# Patient Record
Sex: Male | Born: 1960 | Race: White | Hispanic: No | Marital: Married | Smoking: Former smoker
Health system: Southern US, Community
[De-identification: ages and names within clinical notes are randomized; demographics above are authoritative.]

## PROBLEM LIST (undated history)

## (undated) DIAGNOSIS — G8929 Other chronic pain: Secondary | ICD-10-CM

## (undated) DIAGNOSIS — J302 Other seasonal allergic rhinitis: Secondary | ICD-10-CM

## (undated) DIAGNOSIS — Z951 Presence of aortocoronary bypass graft: Secondary | ICD-10-CM

## (undated) DIAGNOSIS — R079 Chest pain, unspecified: Secondary | ICD-10-CM

## (undated) DIAGNOSIS — M549 Dorsalgia, unspecified: Secondary | ICD-10-CM

## (undated) DIAGNOSIS — R112 Nausea with vomiting, unspecified: Secondary | ICD-10-CM

## (undated) DIAGNOSIS — I25119 Atherosclerotic heart disease of native coronary artery with unspecified angina pectoris: Secondary | ICD-10-CM

## (undated) DIAGNOSIS — K219 Gastro-esophageal reflux disease without esophagitis: Secondary | ICD-10-CM

## (undated) DIAGNOSIS — F419 Anxiety disorder, unspecified: Secondary | ICD-10-CM

## (undated) DIAGNOSIS — Z8709 Personal history of other diseases of the respiratory system: Secondary | ICD-10-CM

## (undated) DIAGNOSIS — R600 Localized edema: Secondary | ICD-10-CM

## (undated) DIAGNOSIS — R609 Edema, unspecified: Secondary | ICD-10-CM

## (undated) DIAGNOSIS — I219 Acute myocardial infarction, unspecified: Secondary | ICD-10-CM

## (undated) DIAGNOSIS — E785 Hyperlipidemia, unspecified: Secondary | ICD-10-CM

## (undated) DIAGNOSIS — I493 Ventricular premature depolarization: Secondary | ICD-10-CM

## (undated) DIAGNOSIS — I10 Essential (primary) hypertension: Secondary | ICD-10-CM

## (undated) DIAGNOSIS — Z860100 Personal history of colon polyps, unspecified: Secondary | ICD-10-CM

## (undated) DIAGNOSIS — Z9889 Other specified postprocedural states: Secondary | ICD-10-CM

## (undated) DIAGNOSIS — I2510 Atherosclerotic heart disease of native coronary artery without angina pectoris: Secondary | ICD-10-CM

## (undated) DIAGNOSIS — R238 Other skin changes: Secondary | ICD-10-CM

## (undated) DIAGNOSIS — R233 Spontaneous ecchymoses: Secondary | ICD-10-CM

## (undated) DIAGNOSIS — I209 Angina pectoris, unspecified: Secondary | ICD-10-CM

## (undated) HISTORY — PX: COLONOSCOPY: SHX174

## (undated) HISTORY — DX: Presence of aortocoronary bypass graft: Z95.1

## (undated) HISTORY — DX: Essential (primary) hypertension: I10

## (undated) HISTORY — PX: CORONARY ANGIOPLASTY: SHX604

## (undated) HISTORY — DX: Chest pain, unspecified: R07.9

## (undated) HISTORY — DX: Atherosclerotic heart disease of native coronary artery with unspecified angina pectoris: I25.119

## (undated) HISTORY — DX: Ventricular premature depolarization: I49.3

---

## 1992-01-26 HISTORY — PX: CORONARY ARTERY BYPASS GRAFT: SHX141

## 1997-12-23 ENCOUNTER — Inpatient Hospital Stay (HOSPITAL_COMMUNITY): Admission: EM | Admit: 1997-12-23 | Discharge: 1997-12-25 | Payer: Self-pay | Admitting: Cardiology

## 1999-01-26 HISTORY — PX: CARDIAC CATHETERIZATION: SHX172

## 1999-10-14 ENCOUNTER — Inpatient Hospital Stay (HOSPITAL_COMMUNITY): Admission: EM | Admit: 1999-10-14 | Discharge: 1999-10-15 | Payer: Self-pay | Admitting: Cardiovascular Disease

## 1999-10-28 ENCOUNTER — Inpatient Hospital Stay (HOSPITAL_COMMUNITY): Admission: AD | Admit: 1999-10-28 | Discharge: 1999-10-29 | Payer: Self-pay | Admitting: Cardiovascular Disease

## 1999-10-29 ENCOUNTER — Encounter: Payer: Self-pay | Admitting: Internal Medicine

## 2000-07-26 ENCOUNTER — Ambulatory Visit (HOSPITAL_COMMUNITY): Admission: RE | Admit: 2000-07-26 | Discharge: 2000-07-26 | Payer: Self-pay | Admitting: Cardiovascular Disease

## 2003-12-23 ENCOUNTER — Ambulatory Visit: Payer: Self-pay | Admitting: Cardiovascular Disease

## 2003-12-25 ENCOUNTER — Ambulatory Visit: Payer: Self-pay | Admitting: Cardiovascular Disease

## 2003-12-25 ENCOUNTER — Ambulatory Visit (HOSPITAL_COMMUNITY): Admission: AD | Admit: 2003-12-25 | Discharge: 2003-12-25 | Payer: Self-pay | Admitting: Cardiovascular Disease

## 2003-12-30 ENCOUNTER — Ambulatory Visit: Payer: Self-pay | Admitting: Cardiovascular Disease

## 2010-06-12 NOTE — Cardiovascular Report (Signed)
[place]. [facility]  Patient:    Patrick Goodwin, Patrick Goodwin                        MRN: [id number] Proc. Date: 07/26/00 Adm. Date:  [id number] Attending:  Colon Branch CC:         Dr. Tomasa Blase, [facility]   Cardiac Catheterization  PROCEDURE PERFORMED:  Coronary arteriography.  INDICATIONS:  Previous coronary artery bypass grafting with chest pain and abnormal Cardiolite study suggesting anterior apical wall ischemia.  CORONARY ARTERIOGRAPHY:  Left main coronary artery:  The left main coronary artery had a 70% discrete lesion.  Left anterior descending artery:  The left anterior descending artery had an 80% ostial lesion.  The first diagonal branch had 80% multiple discrete lesions but was not very approachable through the left main and native system. The mid LAD was 100% occluded.  Circumflex coronary artery:  The circumflex coronary artery was 100% occluded proximally.  Right coronary artery:  The right coronary artery was 100% occluded proximally.  Left internal mammary artery is widely patent in the mid and distal LAD. There was no retrograde filling I the diagonal branches.  The saphenous vein graft to the OM had 20-30% multiple discrete lesions in the mid body.  Saphenous vein graft to the RCA had 60% tubular lesion in the mid body.  This was fairly stable compared to his previous catheterization in 2001.  RIGHT ANTERIOR OBLIQUE VENTRICULOGRAPHY:  RAO ventriculography revealed mild inferior wall hypokinesis with an EF of 55%.  There was no MR.  LV pressure was 138/7, aortic pressure was 137/64.  IMPRESSION:  The patients Cardiolite study suggested ischemia in the anteroapical region.  The left anterior descending supplies this territory well.  The diagonal branch is not well revascularized but is stable since his previous catheterization.  Similarly, the right coronary artery, although it had moderate disease is smooth and there was no ischemia  in the inferior wall by Cardiolite.  RECOMMENDATIONS:  Overall, I think continued medical management is warranted. I will see the patient back in the office in two months.  He will be discharged home later today so long as his groin heals well. DD:  07/26/00 TD:  07/26/00 Job: [id number]

## 2010-06-12 NOTE — Cardiovascular Report (Signed)
[place]. [facility]  Patient:    Patrick Goodwin, Patrick Goodwin                        MRN: [id number] Proc. Date: 10/14/99 Adm. Date:  [id number] Attending:  Colon Branch CC:         Barney Drain, M.D., [place], [place]             Noralyn Pick. Eden Emms, M.D. [facility]             Cardiac Catheterization Laboratory                        Cardiac Catheterization  PROCEDURES PERFORMED: 1. Left heart catheterization with coronary angiography, bypass    graft angiography, and abdominal aortography. 2. Percutaneous transluminal coronary angioplasty with stent placement    in the proximal portion of the saphenous vein graft to the obtuse marginal    branch.  INDICATIONS:  Patrick Goodwin is a 50 year old male who is status post coronary artery bypass surgery.  He presented to the emergency room at [facility] last evening with unstable angina.  He was started on heparin and ReoPro and transferred to [facility].  After stabilization he was referred for cardiac catheterization.  CATHETERIZATION PROCEDURE:  A 6 French sheath was placed in the left femoral artery.  Standard Judkins 6 French catheters were utilized.  Contrast was Omnipaque.  There were no complications.  CATHETERIZATION RESULTS:  HEMODYNAMICS:  Left ventricular pressure 124/15.  Aortic pressure 124/80. There was no aortic valve gradient.  LEFT VENTRICULOGRAM:  There is mild akinesis of the apical wall, otherwise normal wall motion.  Ejection fraction calculated at 52%.  No mitral regurgitation.  CORONARY ARTERIOGRAPHY:  (Right dominant).  Left main:  Normal.  Left anterior descending:  The left anterior descending artery has an 80% stenosis in the proximal vessel before the first diagonal.  After the first diagonal is an 80% stenosis in the mid vessel followed by a 95% stenosis just proximal to the internal mammary artery insertion.  The LAD gives rise to a large first diagonal which has three branches.  The  inferior most branch of this diagonal is 100% occluded and fills by left to left collaterals from the distal LAD.  Left circumflex:  The left circumflex has a 90% stenosis proximally followed by 100% occlusion of the mid vessel.  Right coronary artery:  The right coronary artery is 100% occluded proximally near it ostium.  Left internal mammary artery to the mid LAD is patent throughout its course. In the LAD just beyond the anastomosis is a 25% stenosis.  Saphenous vein graft to the obtuse marginal has a very complex irregular 95% stenosis in the proximal graft just after the ostium with filling defect consistent with probable thrombus.  The mid body of the graft has a 25% stenosis.  Sequential saphenous vein graft to the acute marginal and posterior descending artery is patent throughout its course.  This fills a normal sized acute marginal, a normal sized posterior descending artery, also filling a normal sized first posterolateral and a small second posterolateral branch.  In addition the distal right coronary artery gives collaterals to a small distal obtuse marginal branch.  IMPRESSION: 1. Mildly decreased left ventricular systolic function. 2. Native three-vessel coronary artery disease as described. 3. Status post coronary artery bypass surgery characterized by patent    graft to the left anterior descending artery in the right  coronary artery    system. 4. Very complex 95% stenosis in the proximal portion of the vein graft and    obtuse marginal as described.  PLAN:  Percutaneous intervention to the obtuse marginal; see below.  PERCUTANEOUS TRANSLUMINAL CORONARY ANGIOPLASTY PROCEDURE:  Following completion of diagnostic catheterization, we proceeded with coronary intervention.  The patient was enrolled in the [facility] protocol and randomized to conventional therapy.  We used a 7 [place] LCD guiding catheter and a BMW wire.  Predilatation lesion was performed with a  3.0 x 15 mm Maverick balloon inflated to 4 atmospheres.  We then deployed a 4.0 x 15 mm NIR Elite stent with the proximal portion of the stent right in the ostium of the graft.  This stent was deployed at 10 atmospheres.  Angiographic images at that point revealed an excellent result with 0% stenosis and TIMI-3 flow.  COMPLICATIONS:  None.  RESULTS:  Successful percutaneous transluminal coronary angioplasty with stent placement in the ostium and proximal portion of the saphenous vein graft to obtuse marginal branch.  A complex 95% stenosis with probable thrombus was reduced to 0% residual with TIMI-3 flow.  PLAN:  ReoPro will be continued for 12 hours.  The patient will be treated with Plavix for a minimum of four weeks. DD:  10/14/99 TD:  10/14/99 Job: [id number]

## 2010-06-12 NOTE — Discharge Summary (Signed)
[place]. [facility]  Patient:    Patrick Goodwin, Patrick Goodwin                        MRN: [id number] Adm. Date:  [id number] Disc. Date: [id number] Attending:  Colon Branch Dictator:   Lavella Hammock, P.A. CC:         Dr. Nadine Counts in [place], [place].   Referring Physician Discharge [facility]  DATE OF BIRTH:  11-02-1960  PROCEDURES: 1. Cardiac catheterization. 2. Coronary arteriogram. 3. Left ventriculogram. 4. PTCA and stent of one vessel.  HOSPITAL COURSE:  Patrick Goodwin is a 50 year old male with a history of MI in his 61s and bypass surgery in 1994 with LIMA to LAD, SVG to OM-1, SVG to PD/PLA. He was seen in the office and had a Cardiolite which showed ischemia, and a catheterization was to be arranged.  However, he had prolonged pain on the day of admission and came to the emergency room at [facility].  He was evaluated there and transferred to [facility] for further workup and possible catheterization.  He ruled out for an MI but he was scheduled for a cardiac catheterization and this was done on October 14, 1999.  He had a normal left main, an LAD with an 80% proximal, an 80% mid, and a 95% lesion. He had a 20% distal lesion.  The first diagonal was totaled proximally.  He had a circumflex with a 90% proximal lesion that was then totaled.  The RCA was totalled.  The LIMA to LAD was patent.  The SVG to OM-1 had a 95% proximal stenosis with some haze and possible thrombus.  The SVG to PD/PLA had a 50% mid lesion.  He had PTCA and stent to the SVG to OM-1, which reduced the stenosis from 95% to 0 with TIMI 3 flow.  His EF showed mild apical akinesis with an EF calculated a 52% and no MR.  He tolerated the procedure well and the sheath was removed without difficulty.  His post procedure CK-MB was negative.  His EKG was unchanged.  He was evaluated the next day and considered stable for discharge.  LABORATORY VALUES:  Hemoglobin 14.0,  hematocrit 38.5, wbcs 10.3, platelets 223.  Serial CK-MBs negative for MI before and after procedure.  EKG:  Sinus rhythm and sinus arrhythmia with first degree AV block and a P-R of 0.210.  Incomplete right bundle-branch block and old inferior infarct.  DISCHARGE CONDITION:  Improved  CONSULTS:  None.  COMPLICATIONS:  None.  DISCHARGE DIAGNOSES: 1. Severe native three-vessel coronary artery disease. 2. Status post percutaneous transluminal coronary angioplasty and stent to the    saphenous vein graft to first obtuse marginal, this admission. 3. History of coronary artery bypass grafting in 1994 with left internal    mammary artery to left anterior descending artery, saphenous vein graft    to first obtuse marginal, and saphenous vein graft to posterior    descending/posterolateral artery. 4. Hyperlipidemia. 5. Family history of coronary artery disease. 6. History of a myocardial infarction in his 77s. 7. History of right bundle-branch block.  DISCHARGE INSTRUCTIONS:  ACTIVITY:  His activity is to include no driving, no sexual or strenuous activity for two days.  He is to return to work October 26, 1999.  DIET:  He is to stick to a low fat diet.  WOUND CARE:  He is to call the office for bleeding, swelling, or drainage at the  catheterization site.  FOLLOW-UP:  He has a P.A. appointment on October 11 at 8:30 a.m.  He is to see Dr. Eden Emms in two months.  He is to follow up with Dr. Nadine Counts in [place] as needed.  DISCHARGE MEDICATIONS: 1. Lipitor 20 mg q.d. 2. Lopressor 50 mg one-half tablet b.i.d. 3. Coated aspirin 325 mg q.d. 4. Plavix 75 mg q.d. for one month. 5. Nitroglycerin 0.4 mg sublingual p.r.n. 6. Prilosec 20 mg p.o. q.d. DD:  10/15/99 TD:  10/15/99 Job: [id number]

## 2010-06-12 NOTE — Cardiovascular Report (Signed)
[place]. [facility]  Patient:    Patrick Goodwin, Patrick Goodwin                        MRN: [id number] Proc. Date: 10/29/99 Adm. Date:  [id number] Disc. Date: [id number] Attending:  Colon Branch CC:         Nadine Counts, M.D.  Noralyn Pick. Eden Emms, M.D. [facility]   Cardiac Catheterization  DATE OF BIRTH:  [birth year]  PRIMARY PHYSICIAN:  Nadine Counts, M.D.  CARDIOLOGIST:  Noralyn Pick. Eden Emms, M.D.  PROCEDURE:  Left heart cardiac catheterization/coronary arteriography.  INDICATIONS:  Evaluate patient with known coronary artery disease, status post bypass and angioplasty.  Most recently he had angioplasty and stenting of an ostial vein graft to circumflex marginal lesion.  He presented with recurrent chest discomfort (786.51).  DESCRIPTION OF PROCEDURE:  Left heart catheterization was performed via the right femoral artery.  The artery was cannulated using an anterior wall puncture.  A #6 French arterial sheath was inserted via the modified Seldinger technique.  Preformed Judkins, pigtail, and LIMA catheters were utilized.  The patient tolerated the procedure well and left the lab in stable condition.  RESULTS:  HEMODYNAMICS:  LV 123/21, AO 121/59.  CORONARY ARTERIOGRAPHY:  Left main:  The left main coronary artery was normal.  Left anterior descending:  The LAD proximal 80% stenosis followed by mid long 80% stenosis and a focal 95% stenosis.  There was a small mid diagonal which occluded and filled via collaterals.  Circumflex:  The circumflex had a 95% stenosis followed by an occluded proximal segment.  There was a large mid marginal which filled a vein graft.  Right coronary artery:  The right coronary artery was not selectively injected.  It had previously been demonstrated to be occluded at the ostium. It was a dominant vessel.  GRAFTS:  A LIMA to the LAD was patent.  Saphenous vein graft sequential to the right coronary acute marginal and PDA was patent.   There was a mid vein graft 50% lesion before the first anastomosis to the right coronary artery.  Saphenous vein graft to the obtuse marginal was patent including a widely patent ostial and proximal stented area.  LEFT VENTRICULOGRAM:  The left ventriculogram was obtained in the RAO projection.  The EF was approximately 55%.  CONCLUSION:  Severe native three-vessel coronary artery disease.  Patent bypass grafts with a patent saphenous vein graft stent.  PLAN:  No further cardiovascular testing is suggested.  The patient will continue with aggressive secondary risk factor modification. DD:  10/29/99 TD:  10/30/99 Job: [id number]

## 2010-06-12 NOTE — Cardiovascular Report (Signed)
NAME:  Patrick Goodwin, Patrick Goodwin                 ACCOUNT NO.:  [id number]   MEDICAL RECORD NO.:  [id number]          PATIENT TYPE:  OIB   LOCATION:  2855                         FACILITY:  [facility]   PHYSICIAN:  Charlton Haws, M.D.     DATE OF BIRTH:  [birth year]   DATE OF PROCEDURE:  12/25/2003  DATE OF DISCHARGE:                              CARDIAC CATHETERIZATION   PROCEDURE:  Coronary arteriography.   INDICATIONS:  Previous bypass surgery, end-stenting to the ostium of the  saphenous vein graft to the OM, chest pain, and palpitations.   The patient has had a lot of scar tissue on the right side before with pain  during catheterization.  Therefore, we elected to use a 5 French sheath in  the left femoral artery.   RESULTS:  1.  The left main coronary artery had a 30% diffuse stenosis.  2.  Left anterior descending artery was 100% occluded after take off of the      first diagonal branch.  The first diagonal branch had 70% multiple      discreet lesions.  3.  The circumflex coronary artery was 100% occluded proximally.  4.  The right coronary artery was 100% occluded proximally.  5.  Saphenous vein graft to the right coronary artery had a 60% lesion in      the mid to distal body.  The PDA and PLA themselves were somewhat small      and diffusely diseased.  6.  The saphenous vein graft to the obtuse marginal branch had a widely      patent ostial stent.  Just distal to the stent, there was a 50%      eccentric stenosis and a 40-50% lesion in the mid to distal body of the      graft.  7.  The left internal mammary artery was widely patent to the LAD.   RAO VENTRICULOGRAPHY:  RAO ventriculography showed inferior/posterior basil  wall hypokinesis with an EF of 50%.  There is no gradient across the aortic  valve and no MR.  Aortic pressure is 125/70, LV pressure is 120/114.   IMPRESSION:  I suspect the patient can continue to be treated medically.  We  will have to give him an event monitor to  make sure he is not having an  arrhythmia.  I will have the films reviewed by our interventionalist to see  if they would intervene on the mid-body of the right.  However, during his  last catheterization, he had 50-60% disease and I do not think it is  currently flow-limiting.  However, to have this moderate degree of stenosis  and already having a stent to one vein graft at the age of 71 does not bode  well for the future.   Unfortunately, his LIMA graft is widely patent to the LAD.  I will see the  patient back in the office in a few weeks and again order an event monitor  to rule out any significant tachyarrhythmia.     PN/MEDQ  D:  12/25/2003  T:  12/25/2003  Job:  [id number] 

## 2010-06-12 NOTE — Discharge Summary (Signed)
[place]. [facility]  Patient:    Patrick Goodwin, Patrick Goodwin                        MRN: [id number] Adm. Date:  [id number] Disc. Date: [id number] Attending:  Colon Branch Dictator:   Tereso Newcomer, P.A. CC:         Nadine Counts, M.D.   Referring Physician Discharge [facility]  REASON FOR ADMISSION:  Chest pain consistent with unstable angina.  DISCHARGE DIAGNOSES 1. Coronary artery disease, status post coronary artery bypass graft. 2. First degree atrioventricular block. 3. Right bundle branch block. 4. History of inferior myocardial infarction. 5. Hyperlipidemia.  PROCEDURES PERFORMED THIS ADMISSION:  Cardiac catheterization on October 29, 1999 by Dr. Rollene Rotunda revealing:  Normal left main.  LAD: Proximal 80%, mid long 80% and 95%, diagonal occluded.  Circumflex:  95% proximal, 100% mid.  RCA not injected; 100% proximal on previous films.  LIMA to LAD patent.  SVG to RCA and acute marginal patent.  SVG to OM patent. Stent patent.  ADMISSION HISTORY:  This 50 year old male presented for followup to the office on October 28, 1999.  Since the patients discharge from the hospital on October 15, 1999, he continued to not feel well.  He described generalized fatigue along with some anterior chest discomfort, described as pressure feeling with occasional radiation to the left shoulder and to the back of the neck.  He usually has nausea and palpitations associated with this and the discomfort occurs at any time.  It usually occurred about two times a week and was relieved with sublingual nitroglycerin.  On the night prior to admission, he had had a worse episode and decided to go to [facility].  At [facility], his vital signs were stable.  EKGs did not show any acute changes.  CKs and troponins were negative x 2.  Dr. Doylene Canning. Ladona Ridgel was consulted by the [facility].  He instructed [facility] ER staff to discharge the patient to home if his  second troponin was negative, with close followup in the office the next day.  PHYSICAL EXAMINATION:  Initial physical exam revealed a well-developed white male in no acute distress.  NECK:  Without JVD.  LUNGS:  Clear to auscultation bilaterally.  HEART:  Regular rate and rhythm.  ABDOMEN:  Without abdominal bruits, tenderness or masses.  EXTREMITIES:  Femoral pulses 4+ without bruits. No signs of edema.  HOSPITAL COURSE:  It was decided to admit the patient for coronary angiography to evaluate his symptoms, which seemed consistent with unstable angina.  This was decided upon, especially in light of his recent stent placement to the SVG to OM.  The patient remained stable on October 29, 1999 without pain.  He was taken to the cardiac catheterization lab.  The results of the procedure are noted above.  He had no immediate complications.  On the evening of October 29, 1999, he was found to be in stable condition, with some slight leg pain but no hematoma and intact pulses.  After ambulation, it was decided that patient was stable enough for discharge to home on his current medication regimen.  LABORATORY FINDINGS:  INR 1.1, CK 100.8, CK-MB 0.8, troponin I 0.01.  Labs from [facility]:  Hemoglobin 13.5, hematocrit 39.8.  Electrolytes reportedly within normal limits, according to his admission H&P.  DISCHARGE MEDICATIONS 1. Lopressor 100 mg b.i.d. 2. Plavix 75 mg q.d. 3. Prilosec 20 mg q.d. 4. Aspirin 325  mg q.d. 5. Lipitor 40 mg two tablets p.o. q.h.s. 6. Clorazepate 3.75 mg b.i.d. 7. Sublingual nitroglycerin p.r.n. chest pain.  ACTIVITY:  No driving, heavy lifting or exertion or sex for three days.  DIET:  Low-fat, low-salt, low-cholesterol diet.  WOUND CARE:  The patient is to watch his groin for any increased swelling, bleeding or bruising and to call office with concerns.  FOLLOWUP:  He is to follow up with Dr. Theron Arista C. Nishan in two weeks; he is to call for an  appointment. DD:  10/29/99 TD:  10/30/99 Job: [id number]

## 2010-06-12 NOTE — Discharge Summary (Signed)
[place]. [facility]  Patient:    Patrick Goodwin, Patrick Goodwin                        MRN: [id number] Adm. Date:  [id number] Disc. Date: [id number] Attending:  Colon Branch Dictator:   Tereso Newcomer, P.A.                           Discharge Summary  The patients chest x-ray was read as showing no acute process by the radiologist. DD:  10/29/99 TD:  10/30/99 Job: [id number]

## 2012-03-07 DIAGNOSIS — M4316 Spondylolisthesis, lumbar region: Secondary | ICD-10-CM | POA: Diagnosis present

## 2012-03-07 DIAGNOSIS — M48061 Spinal stenosis, lumbar region without neurogenic claudication: Secondary | ICD-10-CM | POA: Diagnosis present

## 2012-03-21 ENCOUNTER — Encounter (HOSPITAL_COMMUNITY): Payer: Self-pay

## 2012-03-21 ENCOUNTER — Encounter (HOSPITAL_COMMUNITY)
Admission: RE | Admit: 2012-03-21 | Discharge: 2012-03-21 | Disposition: A | Discharge status: Home / Self Care | Payer: Commercial Managed Care - PPO | Source: Ambulatory Visit | Attending: Anesthesiology | Admitting: Anesthesiology

## 2012-03-21 ENCOUNTER — Encounter (HOSPITAL_COMMUNITY)
Admission: RE | Admit: 2012-03-21 | Discharge: 2012-03-21 | Disposition: A | Discharge status: Home / Self Care | Payer: Commercial Managed Care - PPO | Source: Ambulatory Visit | Attending: Orthopedic Surgery | Admitting: Orthopedic Surgery

## 2012-03-21 ENCOUNTER — Encounter (HOSPITAL_COMMUNITY): Payer: Self-pay | Admitting: Pharmacy Technician

## 2012-03-21 HISTORY — DX: Other seasonal allergic rhinitis: J30.2

## 2012-03-21 HISTORY — DX: Other chronic pain: G89.29

## 2012-03-21 HISTORY — DX: Other specified postprocedural states: R11.2

## 2012-03-21 HISTORY — DX: Personal history of colonic polyps: Z86.010

## 2012-03-21 HISTORY — DX: Spontaneous ecchymoses: R23.3

## 2012-03-21 HISTORY — DX: Localized edema: R60.0

## 2012-03-21 HISTORY — DX: Acute myocardial infarction, unspecified: I21.9

## 2012-03-21 HISTORY — DX: Dorsalgia, unspecified: M54.9

## 2012-03-21 HISTORY — DX: Hyperlipidemia, unspecified: E78.5

## 2012-03-21 HISTORY — DX: Edema, unspecified: R60.9

## 2012-03-21 HISTORY — DX: Other specified postprocedural states: Z98.890

## 2012-03-21 HISTORY — DX: Angina pectoris, unspecified: I20.9

## 2012-03-21 HISTORY — DX: Personal history of other diseases of the respiratory system: Z87.09

## 2012-03-21 HISTORY — DX: Gastro-esophageal reflux disease without esophagitis: K21.9

## 2012-03-21 HISTORY — DX: Personal history of colon polyps, unspecified: Z86.0100

## 2012-03-21 HISTORY — DX: Essential (primary) hypertension: I10

## 2012-03-21 HISTORY — DX: Atherosclerotic heart disease of native coronary artery without angina pectoris: I25.10

## 2012-03-21 HISTORY — DX: Anxiety disorder, unspecified: F41.9

## 2012-03-21 HISTORY — DX: Other skin changes: R23.8

## 2012-03-21 LAB — CBC
HCT: 44.7 % (ref 39.0–52.0)
RBC: 5.31 MIL/uL (ref 4.22–5.81)
RDW: 14.7 % (ref 11.5–15.5)
WBC: 9.4 10*3/uL (ref 4.0–10.5)

## 2012-03-21 LAB — SURGICAL PCR SCREEN
MRSA, PCR: NEGATIVE
Staphylococcus aureus: NEGATIVE

## 2012-03-21 LAB — TYPE AND SCREEN
ABO/RH(D): O POS
Antibody Screen: NEGATIVE

## 2012-03-21 LAB — BASIC METABOLIC PANEL
CO2: 26 mEq/L (ref 19–32)
Chloride: 100 mEq/L (ref 96–112)
Creatinine, Ser: 0.92 mg/dL (ref 0.50–1.35)
GFR calc Af Amer: >90 mL/min (ref >90)
Potassium: 4 mEq/L (ref 3.5–5.1)
Sodium: 137 mEq/L (ref 135–145)

## 2012-03-21 LAB — PROTIME-INR: INR: 1.01 (ref 0.00–1.49)

## 2012-03-21 LAB — ABO/RH: ABO/RH(D): O POS

## 2012-03-21 NOTE — Progress Notes (Signed)
Called and let Dr.Munley's nurse know that since pt has come off of ASA and Plavix on the 19th he has noticed chest pain but not to the point of needing Nitroglycerin

## 2012-03-21 NOTE — Pre-Procedure Instructions (Signed)
[place] Blasco [facility]  03/21/2012   Your procedure is scheduled on:  Thurs, Feb 27 @ 7:30 AM  Report to [facility] at 5:30 AM.  Call this number if you have problems the morning of surgery: [phone]   Remember:   Do not eat food or drink liquids after midnight.   Take these medicines the morning of surgery with A SIP OF WATER:   Do not wear jewelry, make-up or nail polish.  Do not wear lotions, powders, or perfumes. You may wear deodorant.  Do not shave 48 hours prior to surgery.  Do not bring valuables to the hospital.  Contacts, dentures or bridgework may not be worn into surgery.  Leave suitcase in the car. After surgery it may be brought to your room.  For patients admitted to the hospital, checkout time is 11:00 AM the day of  discharge.   Patients discharged the day of surgery will not be allowed to drive  home.    Special Instructions: Shower using CHG 2 nights before surgery and the night before surgery.  If you shower the day of surgery use CHG.  Use special wash - you have one bottle of CHG for all showers.  You should use approximately 1/3 of the bottle for each shower.   Please read over the following fact sheets that you were given: Pain Booklet, Coughing and Deep Breathing, MRSA Information and Surgical Site Infection Prevention

## 2012-03-21 NOTE — Progress Notes (Addendum)
Cardiologist is Dr.Munley with last visit 6months ago   Stress test and echo to be requested from Dr.Munley as well as office visit Heart cath to requested from [facility]  Denies CXR being done within past yr  EKG requested from Dr.Munley

## 2012-03-21 NOTE — Progress Notes (Signed)
03/21/12 1540  OBSTRUCTIVE SLEEP APNEA  Have you ever been diagnosed with sleep apnea through a sleep study? No  Do you snore loudly (loud enough to be heard through closed doors)?  1  Do you often feel tired, fatigued, or sleepy during the daytime? 0  Has anyone observed you stop breathing during your sleep? 1  Do you have, or are you being treated for high blood pressure? 1  BMI more than 35 kg/m2? 0  Age over 51 years old? 1  Neck circumference greater than 40 cm/18 inches? (19)  Gender: 1  Obstructive Sleep Apnea Score 5  Score 4 or greater  Results sent to PCP

## 2012-03-22 MED ORDER — DEXAMETHASONE SODIUM PHOSPHATE 4 MG/ML IJ SOLN
4.0000 mg | Freq: Once | INTRAMUSCULAR | Status: DC
  Filled 2012-03-22: qty 1

## 2012-03-22 MED ORDER — CEFAZOLIN SODIUM-DEXTROSE 2-3 GM-% IV SOLR
2.0000 g | INTRAVENOUS | Status: AC
  Administered 2012-03-23: 2 g via INTRAVENOUS
  Filled 2012-03-22: qty 50

## 2012-03-22 NOTE — Progress Notes (Signed)
Anesthesia Chart Review:  Patient is a 52 year old male scheduled for lateral L3-4 fusion with posterior L3-4 fusion by Dr. Shon Baton on 03/23/12.  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  PCP is Dr. Veatrice Kells.  Cardiologist is Dr. Dulce Sellar with [facility]).  He had cleared patient for this procedure, but patient reported that he had experienced daily chest pain since stopping his ASA and Plavix on 03/15/12.  The patient did not feel that it was severe enough to warrant Nitro.  I was going to evaluate Mr. Cronkright PAT testing, but he was inadvertently told to leave after his x-rays.  I did speak with Dr. Dulce Sellar yesterday afternoon about patient's reports of chest pain.  I also faxed him a copy of his PAT EKG.  His office will call patient for a 03/22/12 appointment. (Update: 03/22/12 1625 Received a note from Dr. Dulce Sellar dated 03/22/12.  He still feels patient is stable from a cardiac standpoint.  He feels patient has chronic stable angina. He recommended patient take his metoprolol and Ranexa on the morning of surgery and resume ASA and Plavix as soon as felt safe.  Dr. Dulce Sellar can be reached for questions at [phone].)  EKG on 03/21/12 showed SB @ 59 BPM, incomplete right BBB, inferior infarct (age undetermined).   Treadmill exercise test on 11/03/11 [facility]) showed good exercise capacity, normal treadmill exercise test chief heart rate, negative for ischemia. Normal blood pressure response to exercise.  Cardiac cath on 09/18/10 at [facility] showed 3V occlusive native CAD with 3/3 CABG grafts patent.  Mildly reduced LV systolic function, EF 50%, mild inferior hypokinesis.  CXR on 03/21/12 showed no acute cardiopulmonary findings.  Preoperative labs noted.  Sherry at Dr. Shon Baton office notified of plan for cardiology re-evaluation.  She  will follow-up with [facility] later today for Dr. Hulen Shouts final recommendations.  Shonna Chock, PA-C 03/22/12 [phone]

## 2012-03-23 ENCOUNTER — Encounter (HOSPITAL_COMMUNITY)
Admission: RE | Disposition: A | Discharge status: Home / Self Care | Payer: Self-pay | Source: Ambulatory Visit | Attending: Orthopedic Surgery

## 2012-03-23 ENCOUNTER — Encounter (HOSPITAL_COMMUNITY): Payer: Self-pay | Admitting: *Deleted

## 2012-03-23 ENCOUNTER — Inpatient Hospital Stay (HOSPITAL_COMMUNITY)
Admission: RE | Admit: 2012-03-23 | Discharge: 2012-03-25 | DRG: 460 | Disposition: A | Discharge status: Home / Self Care | Payer: Commercial Managed Care - PPO | Source: Ambulatory Visit | Attending: Orthopedic Surgery | Admitting: Orthopedic Surgery

## 2012-03-23 ENCOUNTER — Encounter (HOSPITAL_COMMUNITY): Payer: Self-pay | Admitting: Vascular Surgery

## 2012-03-23 ENCOUNTER — Ambulatory Visit (HOSPITAL_COMMUNITY): Payer: Commercial Managed Care - PPO

## 2012-03-23 ENCOUNTER — Ambulatory Visit (HOSPITAL_COMMUNITY): Payer: Commercial Managed Care - PPO | Admitting: Anesthesiology

## 2012-03-23 DIAGNOSIS — I209 Angina pectoris, unspecified: Secondary | ICD-10-CM | POA: Diagnosis present

## 2012-03-23 DIAGNOSIS — E785 Hyperlipidemia, unspecified: Secondary | ICD-10-CM | POA: Diagnosis present

## 2012-03-23 DIAGNOSIS — J309 Allergic rhinitis, unspecified: Secondary | ICD-10-CM | POA: Diagnosis present

## 2012-03-23 DIAGNOSIS — M5137 Other intervertebral disc degeneration, lumbosacral region: Principal | ICD-10-CM | POA: Diagnosis present

## 2012-03-23 DIAGNOSIS — I252 Old myocardial infarction: Secondary | ICD-10-CM

## 2012-03-23 DIAGNOSIS — F411 Generalized anxiety disorder: Secondary | ICD-10-CM | POA: Diagnosis present

## 2012-03-23 DIAGNOSIS — Z79899 Other long term (current) drug therapy: Secondary | ICD-10-CM

## 2012-03-23 DIAGNOSIS — E669 Obesity, unspecified: Secondary | ICD-10-CM | POA: Diagnosis present

## 2012-03-23 DIAGNOSIS — M51379 Other intervertebral disc degeneration, lumbosacral region without mention of lumbar back pain or lower extremity pain: Principal | ICD-10-CM | POA: Diagnosis present

## 2012-03-23 DIAGNOSIS — I2510 Atherosclerotic heart disease of native coronary artery without angina pectoris: Secondary | ICD-10-CM | POA: Diagnosis present

## 2012-03-23 DIAGNOSIS — G8929 Other chronic pain: Secondary | ICD-10-CM | POA: Diagnosis present

## 2012-03-23 DIAGNOSIS — Z860100 Personal history of colon polyps, unspecified: Secondary | ICD-10-CM

## 2012-03-23 DIAGNOSIS — I10 Essential (primary) hypertension: Secondary | ICD-10-CM | POA: Diagnosis present

## 2012-03-23 DIAGNOSIS — K219 Gastro-esophageal reflux disease without esophagitis: Secondary | ICD-10-CM | POA: Diagnosis present

## 2012-03-23 DIAGNOSIS — R609 Edema, unspecified: Secondary | ICD-10-CM | POA: Diagnosis present

## 2012-03-23 DIAGNOSIS — M48061 Spinal stenosis, lumbar region without neurogenic claudication: Secondary | ICD-10-CM | POA: Diagnosis present

## 2012-03-23 DIAGNOSIS — M4316 Spondylolisthesis, lumbar region: Secondary | ICD-10-CM | POA: Diagnosis present

## 2012-03-23 HISTORY — PX: ANTERIOR LAT LUMBAR FUSION: SHX1168

## 2012-03-23 SURGERY — ANTERIOR LATERAL LUMBAR FUSION 1 LEVEL
Anesthesia: General | Site: Back | Wound class: Clean

## 2012-03-23 MED ORDER — ACETAMINOPHEN 10 MG/ML IV SOLN
INTRAVENOUS | Status: AC
  Filled 2012-03-23: qty 100

## 2012-03-23 MED ORDER — MORPHINE SULFATE (PF) 1 MG/ML IV SOLN
INTRAVENOUS | Status: AC
  Filled 2012-03-23: qty 25

## 2012-03-23 MED ORDER — LACTATED RINGERS IV SOLN
INTRAVENOUS | Status: DC | PRN
  Administered 2012-03-23 (×2): via INTRAVENOUS

## 2012-03-23 MED ORDER — PANTOPRAZOLE SODIUM 40 MG PO TBEC
40.0000 mg | DELAYED_RELEASE_TABLET | Freq: Every day | ORAL | Status: DC
  Administered 2012-03-24 – 2012-03-25 (×2): 40 mg via ORAL
  Filled 2012-03-23 (×2): qty 1

## 2012-03-23 MED ORDER — DIPHENHYDRAMINE HCL 12.5 MG/5ML PO ELIX
12.5000 mg | ORAL_SOLUTION | Freq: Four times a day (QID) | ORAL | Status: DC | PRN
  Administered 2012-03-23 – 2012-03-24 (×2): 12.5 mg via ORAL
  Filled 2012-03-23 (×2): qty 10

## 2012-03-23 MED ORDER — NALOXONE HCL 0.4 MG/ML IJ SOLN: 0.4000 mg | INTRAMUSCULAR | Status: DC | PRN

## 2012-03-23 MED ORDER — THROMBIN 20000 UNITS EX SOLR
CUTANEOUS | Status: AC
  Filled 2012-03-23: qty 20000

## 2012-03-23 MED ORDER — ONDANSETRON HCL 4 MG/2ML IJ SOLN: 4.0000 mg | INTRAMUSCULAR | Status: DC | PRN

## 2012-03-23 MED ORDER — LISINOPRIL 10 MG PO TABS
10.0000 mg | ORAL_TABLET | Freq: Every day | ORAL | Status: DC
  Administered 2012-03-24 – 2012-03-25 (×2): 10 mg via ORAL
  Filled 2012-03-23 (×2): qty 1

## 2012-03-23 MED ORDER — SUCCINYLCHOLINE CHLORIDE 20 MG/ML IJ SOLN
INTRAMUSCULAR | Status: DC | PRN
  Administered 2012-03-23: 160 mg via INTRAVENOUS

## 2012-03-23 MED ORDER — HEMOSTATIC AGENTS (NO CHARGE) OPTIME
TOPICAL | Status: DC | PRN
  Administered 2012-03-23: 1 via TOPICAL

## 2012-03-23 MED ORDER — ONDANSETRON HCL 4 MG/2ML IJ SOLN: 4.0000 mg | Freq: Four times a day (QID) | INTRAMUSCULAR | Status: DC | PRN

## 2012-03-23 MED ORDER — LIDOCAINE HCL (CARDIAC) 20 MG/ML IV SOLN
INTRAVENOUS | Status: DC | PRN
  Administered 2012-03-23: 100 mg via INTRAVENOUS

## 2012-03-23 MED ORDER — METHOCARBAMOL 500 MG PO TABS
500.0000 mg | ORAL_TABLET | Freq: Four times a day (QID) | ORAL | Status: DC | PRN
  Administered 2012-03-24: 500 mg via ORAL
  Filled 2012-03-23: qty 1

## 2012-03-23 MED ORDER — DEXTROSE 5 % IV SOLN
INTRAVENOUS | Status: DC | PRN
  Administered 2012-03-23: 08:00:00 via INTRAVENOUS

## 2012-03-23 MED ORDER — MIDAZOLAM HCL 5 MG/5ML IJ SOLN
INTRAMUSCULAR | Status: DC | PRN
  Administered 2012-03-23: 2 mg via INTRAVENOUS

## 2012-03-23 MED ORDER — FENTANYL CITRATE 0.05 MG/ML IJ SOLN: 50.0000 ug | Freq: Once | INTRAMUSCULAR | Status: DC

## 2012-03-23 MED ORDER — DEXAMETHASONE 4 MG PO TABS
4.0000 mg | ORAL_TABLET | Freq: Four times a day (QID) | ORAL | Status: DC
  Administered 2012-03-23 – 2012-03-25 (×7): 4 mg via ORAL
  Filled 2012-03-23 (×11): qty 1

## 2012-03-23 MED ORDER — DEXAMETHASONE SODIUM PHOSPHATE 4 MG/ML IJ SOLN
4.0000 mg | Freq: Four times a day (QID) | INTRAMUSCULAR | Status: DC
  Administered 2012-03-24: 4 mg via INTRAVENOUS
  Filled 2012-03-23 (×11): qty 1

## 2012-03-23 MED ORDER — RANOLAZINE ER 500 MG PO TB12
1000.0000 mg | ORAL_TABLET | Freq: Two times a day (BID) | ORAL | Status: DC
  Administered 2012-03-23 – 2012-03-25 (×4): 1000 mg via ORAL
  Filled 2012-03-23 (×5): qty 2

## 2012-03-23 MED ORDER — LACTATED RINGERS IV SOLN
INTRAVENOUS | Status: DC
  Administered 2012-03-23 – 2012-03-24 (×2): via INTRAVENOUS

## 2012-03-23 MED ORDER — FUROSEMIDE 20 MG PO TABS
20.0000 mg | ORAL_TABLET | Freq: Every day | ORAL | Status: DC
  Administered 2012-03-24 – 2012-03-25 (×2): 20 mg via ORAL
  Filled 2012-03-23 (×2): qty 1

## 2012-03-23 MED ORDER — ATORVASTATIN CALCIUM 10 MG PO TABS
10.0000 mg | ORAL_TABLET | Freq: Every day | ORAL | Status: DC
  Administered 2012-03-23 – 2012-03-24 (×2): 10 mg via ORAL
  Filled 2012-03-23 (×3): qty 1

## 2012-03-23 MED ORDER — METHOCARBAMOL 100 MG/ML IJ SOLN
500.0000 mg | Freq: Four times a day (QID) | INTRAVENOUS | Status: DC | PRN
  Filled 2012-03-23: qty 5

## 2012-03-23 MED ORDER — DIPHENHYDRAMINE HCL 50 MG/ML IJ SOLN: 12.5000 mg | Freq: Four times a day (QID) | INTRAMUSCULAR | Status: DC | PRN

## 2012-03-23 MED ORDER — CLOPIDOGREL BISULFATE 75 MG PO TABS
75.0000 mg | ORAL_TABLET | Freq: Every day | ORAL | Status: DC
  Administered 2012-03-25: 75 mg via ORAL
  Filled 2012-03-23 (×2): qty 1

## 2012-03-23 MED ORDER — DOCUSATE SODIUM 100 MG PO CAPS
100.0000 mg | ORAL_CAPSULE | Freq: Two times a day (BID) | ORAL | Status: DC
  Administered 2012-03-23 – 2012-03-25 (×4): 100 mg via ORAL
  Filled 2012-03-23 (×4): qty 1

## 2012-03-23 MED ORDER — EZETIMIBE 10 MG PO TABS
10.0000 mg | ORAL_TABLET | Freq: Every day | ORAL | Status: DC
  Administered 2012-03-24 – 2012-03-25 (×2): 10 mg via ORAL
  Filled 2012-03-23 (×2): qty 1

## 2012-03-23 MED ORDER — MORPHINE SULFATE (PF) 1 MG/ML IV SOLN
INTRAVENOUS | Status: DC
  Administered 2012-03-23: 3 mg via INTRAVENOUS
  Administered 2012-03-23: 2 mg via INTRAVENOUS
  Administered 2012-03-23: 12:00:00 via INTRAVENOUS
  Administered 2012-03-24: 1 mg via INTRAVENOUS
  Administered 2012-03-24: 4 mg via INTRAVENOUS

## 2012-03-23 MED ORDER — OXYCODONE HCL 5 MG/5ML PO SOLN: 5.0000 mg | Freq: Once | ORAL | Status: DC | PRN

## 2012-03-23 MED ORDER — PROPOFOL 10 MG/ML IV BOLUS
INTRAVENOUS | Status: DC | PRN
  Administered 2012-03-23: 260 mg via INTRAVENOUS

## 2012-03-23 MED ORDER — OXYCODONE HCL 5 MG PO TABS
10.0000 mg | ORAL_TABLET | ORAL | Status: DC | PRN
  Administered 2012-03-24 – 2012-03-25 (×3): 10 mg via ORAL
  Filled 2012-03-23 (×3): qty 2

## 2012-03-23 MED ORDER — ACETAMINOPHEN 10 MG/ML IV SOLN
1000.0000 mg | Freq: Four times a day (QID) | INTRAVENOUS | Status: AC
  Administered 2012-03-23 – 2012-03-24 (×4): 1000 mg via INTRAVENOUS
  Filled 2012-03-23 (×4): qty 100

## 2012-03-23 MED ORDER — ARTIFICIAL TEARS OP OINT
TOPICAL_OINTMENT | OPHTHALMIC | Status: DC | PRN
  Administered 2012-03-23: 1 via OPHTHALMIC

## 2012-03-23 MED ORDER — FENTANYL CITRATE 0.05 MG/ML IJ SOLN
INTRAMUSCULAR | Status: DC | PRN
  Administered 2012-03-23 (×5): 50 ug via INTRAVENOUS
  Administered 2012-03-23: 100 ug via INTRAVENOUS
  Administered 2012-03-23 (×2): 50 ug via INTRAVENOUS
  Administered 2012-03-23: 100 ug via INTRAVENOUS

## 2012-03-23 MED ORDER — CEFAZOLIN SODIUM 1-5 GM-% IV SOLN
1.0000 g | Freq: Three times a day (TID) | INTRAVENOUS | Status: AC
  Administered 2012-03-23 – 2012-03-24 (×2): 1 g via INTRAVENOUS
  Filled 2012-03-23 (×2): qty 50

## 2012-03-23 MED ORDER — GLYCOPYRROLATE 0.2 MG/ML IJ SOLN
INTRAMUSCULAR | Status: DC | PRN
  Administered 2012-03-23: 0.4 mg via INTRAVENOUS

## 2012-03-23 MED ORDER — DEXAMETHASONE SODIUM PHOSPHATE 10 MG/ML IJ SOLN
INTRAMUSCULAR | Status: AC
  Administered 2012-03-23: 10 mg via INTRAVENOUS
  Filled 2012-03-23: qty 1

## 2012-03-23 MED ORDER — ONDANSETRON HCL 4 MG/2ML IJ SOLN
INTRAMUSCULAR | Status: DC | PRN
  Administered 2012-03-23 (×2): 4 mg via INTRAVENOUS

## 2012-03-23 MED ORDER — OXYCODONE HCL 5 MG PO TABS: 5.0000 mg | ORAL_TABLET | Freq: Once | ORAL | Status: DC | PRN

## 2012-03-23 MED ORDER — HYDROMORPHONE HCL PF 1 MG/ML IJ SOLN
INTRAMUSCULAR | Status: AC
  Filled 2012-03-23: qty 1

## 2012-03-23 MED ORDER — METOPROLOL TARTRATE 100 MG PO TABS: 100.0000 mg | ORAL_TABLET | Freq: Two times a day (BID) | ORAL | Status: DC

## 2012-03-23 MED ORDER — METOPROLOL TARTRATE 100 MG PO TABS
150.0000 mg | ORAL_TABLET | Freq: Every day | ORAL | Status: DC
  Administered 2012-03-24 – 2012-03-25 (×2): 150 mg via ORAL
  Filled 2012-03-23 (×2): qty 1

## 2012-03-23 MED ORDER — SODIUM CHLORIDE 0.9 % IJ SOLN: 3.0000 mL | INTRAMUSCULAR | Status: DC | PRN

## 2012-03-23 MED ORDER — PROPOFOL INFUSION 10 MG/ML OPTIME
INTRAVENOUS | Status: DC | PRN
  Administered 2012-03-23: 50 ug/kg/min via INTRAVENOUS

## 2012-03-23 MED ORDER — SODIUM CHLORIDE 0.9 % IJ SOLN: 9.0000 mL | INTRAMUSCULAR | Status: DC | PRN

## 2012-03-23 MED ORDER — FLEET ENEMA 7-19 GM/118ML RE ENEM: 1.0000 | ENEMA | Freq: Once | RECTAL | Status: AC | PRN

## 2012-03-23 MED ORDER — ALBUTEROL SULFATE HFA 108 (90 BASE) MCG/ACT IN AERS
2.0000 | INHALATION_SPRAY | Freq: Four times a day (QID) | RESPIRATORY_TRACT | Status: DC | PRN
  Filled 2012-03-23: qty 6.7

## 2012-03-23 MED ORDER — PHENOL 1.4 % MT LIQD: 1.0000 | OROMUCOSAL | Status: DC | PRN

## 2012-03-23 MED ORDER — ALPRAZOLAM 0.5 MG PO TABS
0.5000 mg | ORAL_TABLET | Freq: Three times a day (TID) | ORAL | Status: DC | PRN
  Administered 2012-03-24: 0.5 mg via ORAL
  Filled 2012-03-23: qty 1

## 2012-03-23 MED ORDER — BUPIVACAINE-EPINEPHRINE 0.25% -1:200000 IJ SOLN
INTRAMUSCULAR | Status: DC | PRN
  Administered 2012-03-23: 10 mL

## 2012-03-23 MED ORDER — THROMBIN 20000 UNITS EX SOLR
OROMUCOSAL | Status: DC | PRN
  Administered 2012-03-23: 09:00:00 via TOPICAL

## 2012-03-23 MED ORDER — SODIUM CHLORIDE 0.9 % IV SOLN: 250.0000 mL | INTRAVENOUS | Status: DC

## 2012-03-23 MED ORDER — ACETAMINOPHEN 10 MG/ML IV SOLN
INTRAVENOUS | Status: DC | PRN
  Administered 2012-03-23: 1000 mg via INTRAVENOUS

## 2012-03-23 MED ORDER — PROMETHAZINE HCL 25 MG/ML IJ SOLN: 6.2500 mg | INTRAMUSCULAR | Status: DC | PRN

## 2012-03-23 MED ORDER — METOPROLOL TARTRATE 100 MG PO TABS
100.0000 mg | ORAL_TABLET | Freq: Every day | ORAL | Status: DC
  Administered 2012-03-23 – 2012-03-24 (×2): 100 mg via ORAL
  Filled 2012-03-23 (×3): qty 1

## 2012-03-23 MED ORDER — SODIUM CHLORIDE 0.9 % IJ SOLN
3.0000 mL | Freq: Two times a day (BID) | INTRAMUSCULAR | Status: DC
  Administered 2012-03-24: 3 mL via INTRAVENOUS

## 2012-03-23 MED ORDER — MENTHOL 3 MG MT LOZG: 1.0000 | LOZENGE | OROMUCOSAL | Status: DC | PRN

## 2012-03-23 MED ORDER — EPHEDRINE SULFATE 50 MG/ML IJ SOLN
INTRAMUSCULAR | Status: DC | PRN
  Administered 2012-03-23: 15 mg via INTRAVENOUS

## 2012-03-23 MED ORDER — SODIUM CHLORIDE 0.9 % IV SOLN
10.0000 mg | INTRAVENOUS | Status: DC | PRN
  Administered 2012-03-23: 25 ug/min via INTRAVENOUS

## 2012-03-23 MED ORDER — MIDAZOLAM HCL 2 MG/2ML IJ SOLN: 1.0000 mg | INTRAMUSCULAR | Status: DC | PRN

## 2012-03-23 MED ORDER — HYDROMORPHONE HCL PF 1 MG/ML IJ SOLN
0.2500 mg | INTRAMUSCULAR | Status: DC | PRN
  Administered 2012-03-23 (×2): 0.5 mg via INTRAVENOUS

## 2012-03-23 SURGICAL SUPPLY — 75 items
ADH SKN CLS APL DERMABOND .7 (GAUZE/BANDAGES/DRESSINGS) ×1
APPLIER CLIP 11 MED OPEN (CLIP)
APR CLP MED 11 20 MLT OPN (CLIP)
BLADE SURG 10 STRL SS (BLADE) ×2 IMPLANT
BLADE SURG ROTATE 9660 (MISCELLANEOUS) ×1 IMPLANT
CAGE COUGAR IS 18X12X55 (Cage) ×1 IMPLANT
CLIP APPLIE 11 MED OPEN (CLIP) IMPLANT
CLOTH BEACON ORANGE TIMEOUT ST (SAFETY) ×2 IMPLANT
CLSR STERI-STRIP ANTIMIC 1/2X4 (GAUZE/BANDAGES/DRESSINGS) ×2 IMPLANT
CORDS BIPOLAR (ELECTRODE) ×2 IMPLANT
COVER SURGICAL LIGHT HANDLE (MISCELLANEOUS) ×2 IMPLANT
DERMABOND ADVANCED (GAUZE/BANDAGES/DRESSINGS) ×1
DERMABOND ADVANCED .7 DNX12 (GAUZE/BANDAGES/DRESSINGS) ×1 IMPLANT
DRAPE C-ARM 42X72 X-RAY (DRAPES) ×2 IMPLANT
DRAPE ORTHO SPLIT 77X108 STRL (DRAPES) ×2
DRAPE POUCH INSTRU U-SHP 10X18 (DRAPES) ×2 IMPLANT
DRAPE SURG ORHT 6 SPLT 77X108 (DRAPES) ×1 IMPLANT
DRAPE U-SHAPE 47X51 STRL (DRAPES) ×4 IMPLANT
DRSG MEPILEX BORDER 4X8 (GAUZE/BANDAGES/DRESSINGS) ×3 IMPLANT
DURAPREP 26ML APPLICATOR (WOUND CARE) ×2 IMPLANT
ELECT BLADE 4.0 EZ CLEAN MEGAD (MISCELLANEOUS) ×2
ELECT CAUTERY BLADE 6.4 (BLADE) ×2 IMPLANT
ELECT REM PT RETURN 9FT ADLT (ELECTROSURGICAL) ×2
ELECTRODE BLDE 4.0 EZ CLN MEGD (MISCELLANEOUS) ×1 IMPLANT
ELECTRODE REM PT RTRN 9FT ADLT (ELECTROSURGICAL) ×1 IMPLANT
GAUZE SPONGE 4X4 16PLY XRAY LF (GAUZE/BANDAGES/DRESSINGS) ×2 IMPLANT
GLOVE BIOGEL PI IND STRL 6.5 (GLOVE) ×1 IMPLANT
GLOVE BIOGEL PI IND STRL 8.5 (GLOVE) ×1 IMPLANT
GLOVE BIOGEL PI INDICATOR 6.5 (GLOVE) ×1
GLOVE BIOGEL PI INDICATOR 8.5 (GLOVE) ×1
GLOVE ECLIPSE 6.0 STRL STRAW (GLOVE) ×2 IMPLANT
GLOVE ECLIPSE 8.5 STRL (GLOVE) ×2 IMPLANT
GOWN PREVENTION PLUS XXLARGE (GOWN DISPOSABLE) ×2 IMPLANT
GOWN STRL NON-REIN LRG LVL3 (GOWN DISPOSABLE) ×4 IMPLANT
GUIDEWIRE 1.6X480MM (WIRE) ×2 IMPLANT
GUIDEWIRE PIPELINE IS BLUNT (WIRE) ×1 IMPLANT
KIT BASIN OR (CUSTOM PROCEDURE TRAY) ×2 IMPLANT
KIT ORACLE NEUROMONITING (KITS) ×1 IMPLANT
KIT ROOM TURNOVER OR (KITS) ×2 IMPLANT
MIX DBX 20CC MTF (Putty) ×1 IMPLANT
NDL I-PASS III (NEEDLE) IMPLANT
NDL SPNL 18GX3.5 QUINCKE PK (NEEDLE) ×1 IMPLANT
NEEDLE 22X1 1/2 (OR ONLY) (NEEDLE) ×2 IMPLANT
NEEDLE I-PASS III (NEEDLE) IMPLANT
NEEDLE SPNL 18GX3.5 QUINCKE PK (NEEDLE) ×2 IMPLANT
NS IRRIG 1000ML POUR BTL (IV SOLUTION) ×2 IMPLANT
PACK LAMINECTOMY ORTHO (CUSTOM PROCEDURE TRAY) ×2 IMPLANT
PACK UNIVERSAL I (CUSTOM PROCEDURE TRAY) ×2 IMPLANT
PAD ARMBOARD 7.5X6 YLW CONV (MISCELLANEOUS) ×4 IMPLANT
ROD MATRIX MIS 45MM (Rod) ×1 IMPLANT
SCREW MATRIX MIS 6.0X45MM (Screw) ×2 IMPLANT
SPONGE INTESTINAL PEANUT (DISPOSABLE) ×4 IMPLANT
SPONGE LAP 4X18 X RAY DECT (DISPOSABLE) ×2 IMPLANT
SPONGE SURGIFOAM ABS GEL 100 (HEMOSTASIS) ×1 IMPLANT
STRIP CLOSURE SKIN 1/2X4 (GAUZE/BANDAGES/DRESSINGS) IMPLANT
SURGIFLO TRUKIT (HEMOSTASIS) ×1 IMPLANT
SUT MNCRL AB 3-0 PS2 18 (SUTURE) ×2 IMPLANT
SUT PROLENE 5 0 C 1 24 (SUTURE) IMPLANT
SUT SILK 2 0 TIES 10X30 (SUTURE) ×1 IMPLANT
SUT SILK 3 0 TIES 10X30 (SUTURE) ×1 IMPLANT
SUT VIC AB 1 CT1 27 (SUTURE) ×4
SUT VIC AB 1 CT1 27XBRD ANBCTR (SUTURE) ×2 IMPLANT
SUT VIC AB 1 CTX 36 (SUTURE) ×4
SUT VIC AB 1 CTX36XBRD ANBCTR (SUTURE) ×2 IMPLANT
SUT VIC AB 2-0 CT1 18 (SUTURE) ×2 IMPLANT
SYR BULB IRRIGATION 50ML (SYRINGE) ×2 IMPLANT
SYR CONTROL 10ML LL (SYRINGE) ×2 IMPLANT
TAP CANN 5MM (TAP) ×1 IMPLANT
TAPE CLOTH 4X10 WHT NS (GAUZE/BANDAGES/DRESSINGS) ×4 IMPLANT
TOWEL OR 17X24 6PK STRL BLUE (TOWEL DISPOSABLE) ×2 IMPLANT
TOWEL OR 17X26 10 PK STRL BLUE (TOWEL DISPOSABLE) ×2 IMPLANT
TRAY FOLEY CATH 14FRSI W/METER (CATHETERS) ×2 IMPLANT
WATER STERILE IRR 1000ML POUR (IV SOLUTION) ×2 IMPLANT
WAVEGUIDE NARROW/FLAT LIGHT (MISCELLANEOUS) ×2 IMPLANT
matrix mis locking cap ×2 IMPLANT

## 2012-03-23 NOTE — Transfer of Care (Signed)
Immediate Anesthesia Transfer of Care Note  Patient: Patrick Goodwin  Procedure(s) Performed: Procedure(s) with comments: ANTERIOR LATERAL LUMBAR FUSION 1 LEVEL (N/A) - XLIF L3-4 WITH POSTERIOR SPINAL FUSION INTERBODY L3-4  Patient Location: PACU  Anesthesia Type:General  Level of Consciousness: oriented, sedated, patient cooperative and responds to stimulation  Airway & Oxygen Therapy: Patient Spontanous Breathing and Patient connected to nasal cannula oxygen  Post-op Assessment: Report given to PACU RN, Post -op Vital signs reviewed and stable, Patient moving all extremities and Patient moving all extremities X 4  Post vital signs: Reviewed and stable  Complications: No apparent anesthesia complications

## 2012-03-23 NOTE — Anesthesia Postprocedure Evaluation (Signed)
  Anesthesia Post-op Note  Patient: Patrick Goodwin  Procedure(s) Performed: Procedure(s) with comments: ANTERIOR LATERAL LUMBAR FUSION 1 LEVEL (N/A) - XLIF L3-4 WITH POSTERIOR SPINAL FUSION INTERBODY L3-4  Patient Location: PACU  Anesthesia Type:General  Level of Consciousness: awake  Airway and Oxygen Therapy: Patient Spontanous Breathing  Post-op Pain: mild  Post-op Assessment: Post-op Vital signs reviewed, Patient's Cardiovascular Status Stable, Respiratory Function Stable, Patent Airway, No signs of Nausea or vomiting and Pain level controlled  Post-op Vital Signs: stable  Complications: No apparent anesthesia complications

## 2012-03-23 NOTE — Progress Notes (Signed)
UR COMPLETED  

## 2012-03-23 NOTE — Op Note (Signed)
NAME:  Patrick Goodwin, Patrick Goodwin                 ACCOUNT NO.:  [id number]  MEDICAL RECORD NO.:  [id number]  LOCATION:  [id number]                        FACILITY:  [facility]  PHYSICIAN:  Alvy Beal, MD    DATE OF BIRTH:  05/07/1960  DATE OF PROCEDURE: DATE OF DISCHARGE:                              OPERATIVE REPORT   PREOPERATIVE DIAGNOSIS:  Degenerative lumbar disk disease with spinal stenosis L3-4.  POSTOPERATIVE DIAGNOSIS:  Degenerative lumbar disk disease with spinal stenosis L3-4.  OPERATIVE PROCEDURE:  Anterolateral L3-4 interbody fusion using a DePuy radiolucent carbon fiber cage, size 12 parallel packed with DBX mix with supplemental posterior pedicle screw fixation and posterolateral arthrodesis.  Pedicle screw system was Synthes MIS matrix pedicle screws 45 mm in length, 6.5 diameter screws with DBX mix for the arthrodesis.  COMPLICATIONS:  None.  CONDITION:  Stable.  HISTORY:  This is a very pleasant gentleman who presented to me for 2nd opinion concerning severe back, buttock, and bilateral radicular leg pain.  Clinical and radiographic analysis confirmed foraminal stenosis and degenerative disk disease.  After discussing treatment options, the patient elected to proceed with surgery.  He had tried and failed prolonged conservative management prior to seeing me.  All appropriate risks, benefits, and alternatives to surgery were discussed with the patient and consent was obtained.  OPERATIVE NOTE:  The patient was brought to the operating room, placed supine on the operating table.  After successful induction of general anesthesia and endotracheal intubation, TEDs, SCDs, and Foley were inserted.  The neuromonitoring representative from neurostim, then placed all appropriate needles for SSEP and EMG neuro monitoring.  The patient was then turned into the lateral decubitus position left side up on a Skytron bed.  All bony prominences were well padded.  He was properly positioned for  the surgery.  Once patient was taped into position and confirmed satisfactory alignment in both planes with fluoro.  We then prepped and draped the back and flank.  Time-out was then done confirming patient, procedure, and all other pertinent important data.  Once this was done, x-ray was brought into the field and confirmed the anterior and posterior margins of the 3-4 disk.  I then made an incision.  I infiltrated the proposed incision site with 0.25% Marcaine.  I then made a transverse incision. I made an incision along the midline with the disk space.  Sharp dissection was carried out down to the adipose tissue to the fascia of the external oblique.  I then made a small counter incision approximately 1 fingerbreadth posteriorly.  I advanced my finger bluntly through down to the retroperitoneal fascia.  I then popped through the retroperitoneal fascia.  I then pushed my finger.  I then was able to sweep along the surface of the iliopsoas muscle and then mobilized the retroperitoneal fat.  Once I had mobilized this, I then delivered my finger on the under surface of the oblique muscles.  I then bluntly dissected through the external and internal oblique until I could see my finger.  I then placed the initial targeting device down on to the surface of the sella.  I had extended the surface of the sella to make  sure that I was not injuring the lumbar plexus.  Once this was done, I then went through the psoas down to the lateral aspect of the disk space.  I confirmed position in both planes.  Once I had confirmed this, I then secured a guide pin through the disk to hold the initial trocar in place.  Again, I then circumferentially probe with the neuromonitoring device to confirm that I was not traumatized in the plexus.  I then placed a sequential dilators and then placed the final distractor.  Once the retracting system was in place, I secured it to the table.  I then removed the initial  trocars and then confirmed that I could see the lateral aspect of the disk space.  Once I was happy with its position in the AP, lateral, and the both planes, I then secured it to the disk space with a blade.  Once this was done, I could now completely visualized the lateral surface of the L3-4 disk space.  I again probed along the retractor blades to ensure that I was not traumatizing the plexus.  Once this was confirmed, I then reconfirmed with AP and lateral fluoroscopy that I was again at the L3-4 disk space. Once this was done, I then incised the disk space using a #10 blade scalpel and then used a combination of pituitary rongeurs, curettes, and Kerrison rongeurs to remove all of the disk material.  Great care was taken not to violate the anterior longitudinal ligament.  I then used an osteotome and Cobb elevator to release the anulus on the contralateral side.  Once this was done, I rasped the endplates until I had bleeding subchondral bone.  Once this was done, I then used trial devices to make sure to get the appropriate size interbody spacer.  I elected to use a 12 parallel as it provided an excellent fit without compromising the endplates.  At this point, I then irrigated the wound copiously with normal saline and made sure I had hemostasis using bipolar electrocautery and Floseal.  Once I irrigated out all the Floseal, I then obtained the final implant and malleted it to the appropriate resting position.  I was pleased with its position in both the AP and lateral planes.  I had end-to-end contact with the endplates and good positioning.  At this point, I then irrigated and removed the retracting device and made sure there was no bleeding within the psoas muscle.  I then closed this wound by closing the fascia of the external oblique with a running #1 Vicryl suture, and then a layer of 2-0 Vicryl sutures and a 3-0 Monocryl for the skin.  I irrigated out the separate  stab incisions where I used my finger to develop my retroperitoneal dissection, and irrigated and closed in a layered fashion with 0 Vicryl suture, 2-0 Vicryl suture, and 3-0 Monocryl.  At this point, the table was then returned to a level position and the patient was maintained in the lateral position.  I then made a 3rd incision centered over the lateral aspect of the L3 pedicle.  I advanced the Jamshidi needle down to the lateral aspect of the facet complex.  This was the proper starting position for the pedicle screw.  I confirmed in both planes and then using neuro monitoring, I advanced the Jamshidi needle through the pedicle and into the vertebral body.  Again, confirmed trajectory in both planes.  I repeated this at the L4 level on that left side.  Once this was done, guide pins were placed.  I then tapped over the guide pin and then placed the pedicle screws.  I stimulated both screws confirming electrodiagnostically that there was no breach or nerve root compression.  X-rays were satisfactory.  The graft and hardware were in good position.  I then used a small Cobb elevator to decorticate the facet complex at that level.  Once this was done, I then measured for a rod and then placed a rod and secured it down to the pedicle screws.  It was torqued according to manufacturer standards.  Once this was done, I then placed a funnel on the lateral side of the rod and then advanced the remaining portion of DBX mix into the posterolateral gutter.  These wounds were irrigated and closed in a layered fashion with interrupted #1 Vicryl suture, 2-0 Vicryl suture, and 3-0 Monocryl. Steri-Strips and dry dressing were applied.  The patient was extubated, transferred to PACU without incident.  At the end of the case, all needle and sponge counts were correct.  There were no adverse intraoperative events.     Alvy Beal, MD     DDB/MEDQ  D:  03/23/2012  T:  03/23/2012  Job:   [id number]

## 2012-03-23 NOTE — Progress Notes (Signed)
PHYSICAL THERAPY CANCELLATION NOTE:  03/23/2012  Physical therapy session cancelled secondary to pt in surgery.  Will re-evaluate pt for PT when cleared to participate by MD.   Charlotte Brafford L. Mazal Ebey DPT [phone] 03/23/2012  

## 2012-03-23 NOTE — Anesthesia Preprocedure Evaluation (Signed)
Anesthesia Evaluation  Patient identified by MRN, date of birth, ID band Patient awake    Reviewed: Allergy & Precautions, H&P , NPO status , Patient's Chart, lab work & pertinent test results  History of Anesthesia Complications (+) PONV  Airway Mallampati: II TM Distance: >3 FB Neck ROM: Full    Dental   Pulmonary  breath sounds clear to auscultation        Cardiovascular hypertension, + angina + CAD and + Past MI Rhythm:Regular Rate:Normal     Neuro/Psych Anxiety    GI/Hepatic GERD-  ,  Endo/Other    Renal/GU      Musculoskeletal   Abdominal (+) + obese,   Peds  Hematology   Anesthesia Other Findings   Reproductive/Obstetrics                           Anesthesia Physical Anesthesia Plan  ASA: III  Anesthesia Plan: General   Post-op Pain Management:    Induction: Intravenous  Airway Management Planned: Oral ETT  Additional Equipment:   Intra-op Plan:   Post-operative Plan: Extubation in OR  Informed Consent: I have reviewed the patients History and Physical, chart, labs and discussed the procedure including the risks, benefits and alternatives for the proposed anesthesia with the patient or authorized representative who has indicated his/her understanding and acceptance.     Plan Discussed with: CRNA and Surgeon  Anesthesia Plan Comments:         Anesthesia Quick Evaluation

## 2012-03-23 NOTE — Preoperative (Signed)
Beta Blockers   Reason not to administer Beta Blockers:Metoprolol 150 mg po @0430hr  on 03/23/2012

## 2012-03-23 NOTE — Brief Op Note (Signed)
03/23/2012  10:37 AM  PATIENT:  Patrick Goodwin  52 y.o. 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  PROCEDURE:  Procedure(s) with comments: ANTERIOR LATERAL LUMBAR FUSION 1 LEVEL (N/A) - XLIF L3-4 WITH POSTERIOR SPINAL FUSION INTERBODY L3-4  SURGEON:  Surgeon(s) and Role:    * Venita Lick, MD - Primary  PHYSICIAN ASSISTANT:   ASSISTANTS: none   ANESTHESIA:   General  EBL:  Total I/O In: 2050 [I.V.:2050] Out: 500 [Urine:300; Blood:200]  BLOOD ADMINISTERED:none  DRAINS: none   LOCAL MEDICATIONS USED:  MARCAINE     SPECIMEN:  No Specimen  DISPOSITION OF SPECIMEN:  N/A  COUNTS:  YES  TOURNIQUET:  * No tourniquets in log *  DICTATION: .Other Dictation: Dictation Number [id number]  PLAN OF CARE: Admit to inpatient   PATIENT DISPOSITION:  PACU - hemodynamically stable.

## 2012-03-23 NOTE — H&P (Signed)
Patient with ongoing progressive back pain Failed to respond to conservative treatment Plan on XLIF at L3-4 for DDD with spinal stenosis with supplemental PSFI No change to clinical exam H+P reviewed - see office notes

## 2012-03-23 NOTE — Anesthesia Procedure Notes (Signed)
Procedure Name: Intubation Date/Time: 03/23/2012 7:38 AM Performed by: Wray Kearns A Pre-anesthesia Checklist: Patient identified, Timeout performed, Emergency Drugs available, Suction available and Patient being monitored Patient Re-evaluated:Patient Re-evaluated prior to inductionOxygen Delivery Method: Circle system utilized Preoxygenation: Pre-oxygenation with 100% oxygen Intubation Type: IV induction and Cricoid Pressure applied Ventilation: Mask ventilation without difficulty and Oral airway inserted - appropriate to patient size Laryngoscope Size: Mac and 4 Grade View: Grade I Tube type: Oral Tube size: 8.0 mm Number of attempts: 1 Airway Equipment and Method: Stylet and LTA kit utilized Placement Confirmation: ETT inserted through vocal cords under direct vision,  breath sounds checked- equal and bilateral and positive ETCO2 Secured at: 23 cm Tube secured with: Tape Dental Injury: Teeth and Oropharynx as per pre-operative assessment

## 2012-03-24 ENCOUNTER — Inpatient Hospital Stay (HOSPITAL_COMMUNITY): Payer: Commercial Managed Care - PPO

## 2012-03-24 NOTE — Progress Notes (Signed)
PT is recommending outpatient PT and not SNF. CSW will make CM aware. Clinical Social Worker will sign off for now as social work intervention is no longer needed. Please consult [place] again if new need arises.   Sabino Niemann, MSW [phone]

## 2012-03-24 NOTE — Evaluation (Signed)
Occupational Therapy Evaluation Patient Details Name: Patrick Goodwin MRN: [id number] DOB: 04-18-1960 Today's Date: 03/24/2012 Time: 1914-7829 OT Time Calculation (min): 29 min  OT Assessment / Plan / Recommendation Clinical Impression  Pt demos decline in function with LB ADLs, safety and balance following back surgery. Pt would benefit from OT services to address these impairments to help restore PLOF to return home    OT Assessment  Patient needs continued OT Services    Follow Up Recommendations  Home health OT    Barriers to Discharge None    Equipment Recommendations  3 in 1 bedside comode;Tub/shower bench;Other (comment) (ADL A/E kit)    Recommendations for Other Services    Frequency  Min 2X/week    Precautions / Restrictions Precautions Precautions: Back;Fall Precaution Booklet Issued: Yes (comment) Precaution Comments: Pt able to verbalize back precautions.  Required Braces or Orthoses: Spinal Brace Spinal Brace: Lumbar corset;Applied in sitting position Restrictions Weight Bearing Restrictions: No   Pertinent Vitals/Pain     ADL  Grooming: Performed;Wash/dry hands;Wash/dry face;Supervision/safety;Set up Where Assessed - Grooming: Supported standing Upper Body Bathing: Simulated;Supervision/safety;Set up Lower Body Bathing: Minimal assistance;Simulated Upper Body Dressing: Performed;Supervision/safety Lower Body Dressing: Performed;Minimal assistance Toilet Transfer: Performed;Supervision/safety;Min guard Toilet Transfer Equipment: Raised toilet seat with arms (or 3-in-1 over toilet) Toileting - Clothing Manipulation and Hygiene: Performed;Min guard ADL Comments: pt and wife provided with education and demo of ADL A/E and tub bench for home use    OT Diagnosis: Generalized weakness;Acute pain  OT Problem List: Decreased knowledge of use of DME or AE;Decreased knowledge of precautions;Pain;Impaired balance (sitting and/or standing) OT Treatment Interventions:  Self-care/ADL training;Balance training;Therapeutic exercise;Neuromuscular education;Therapeutic activities;DME and/or AE instruction;Patient/family education   OT Goals Acute Rehab OT Goals OT Goal Formulation: With patient/family Time For Goal Achievement: 03/31/12 Potential to Achieve Goals: Good ADL Goals Pt Will Perform Grooming: with set-up;Standing at sink ADL Goal: Grooming - Progress: Goal set today Pt Will Perform Lower Body Bathing: with set-up;with supervision;with adaptive equipment ADL Goal: Lower Body Bathing - Progress: Goal set today Pt Will Perform Lower Body Dressing: with set-up;with supervision;with adaptive equipment ADL Goal: Lower Body Dressing - Progress: Goal set today Pt Will Transfer to Toilet: with supervision;with DME ADL Goal: Toilet Transfer - Progress: Goal set today Pt Will Perform Toileting - Clothing Manipulation: with supervision;Standing ADL Goal: Toileting - Clothing Manipulation - Progress: Goal set today Pt Will Perform Tub/Shower Transfer: Tub transfer;with DME ADL Goal: Tub/Shower Transfer - Progress: Goal set today  Visit Information  Last OT Received On: 03/24/12 Assistance Needed: +1    Subjective Data  Subjective: " I hope to go home tomorrow " Patient Stated Goal: To return home   Prior Functioning     Home Living Lives With: Spouse;Daughter Available Help at Discharge: Family Type of Home: House Home Access: Stairs to enter Secretary/administrator of Steps: 3 Entrance Stairs-Rails: None Home Layout: One level Bathroom Shower/Tub: Engineer, manufacturing systems: Standard Bathroom Accessibility: Yes Home Adaptive Equipment: Bedside commode/3-in-1 Prior Function Level of Independence: Independent Able to Take Stairs?: Yes Driving: Yes Communication Communication: No difficulties Dominant Hand: Right         Vision/Perception Vision - History Baseline Vision: Wears glasses all the time Patient Visual Report: No  change from baseline Perception Perception: Within Functional Limits   Cognition  Cognition Overall Cognitive Status: Appears within functional limits for tasks assessed/performed Arousal/Alertness: Awake/alert Orientation Level: Oriented X4 / Intact Behavior During Session: [facility] for tasks performed    Extremity/Trunk Assessment Right  Upper Extremity Assessment RUE ROM/Strength/Tone: [facility] for tasks assessed Left Upper Extremity Assessment LUE ROM/Strength/Tone: WFL for tasks assessed Right Lower Extremity Assessment RLE ROM/Strength/Tone: WFL for tasks assessed RLE Sensation: WFL - Light Touch Left Lower Extremity Assessment LLE ROM/Strength/Tone: WFL for tasks assessed LLE Sensation: WFL - Light Touch     Mobility Bed Mobility Bed Mobility: Rolling Right;Right Sidelying to Sit;Sitting - Scoot to Edge of Bed Rolling Right: 5: Supervision Right Sidelying to Sit: 5: Supervision Sitting - Scoot to Edge of Bed: 5: Supervision Details for Bed Mobility Assistance: Required cues for safety and back precautions. Transfers Sit to Stand: 4: Min guard;From bed;From chair/3-in-1 Stand to Sit: 4: Min guard;With upper extremity assist;With armrests;To chair/3-in-1 Details for Transfer Assistance: Required cues for safety and back precautions to limit bending.      Exercise     Balance Balance Balance Assessed: No   End of Session OT - End of Session Equipment Utilized During Treatment: Gait belt (3 in 1, ADL A/E) Activity Tolerance: Patient tolerated treatment well Patient left: in chair;with family/visitor present;with call bell/phone within reach  GO     Galen Manila 03/24/2012, 11:05 AM

## 2012-03-24 NOTE — Evaluation (Signed)
Physical Therapy Evaluation Patient Details Name: Patrick Goodwin MRN: [id number] DOB: December 15, 1960 Today's Date: 03/24/2012 Time: 8657-8469 PT Time Calculation (min): 22 min  PT Assessment / Plan / Recommendation Clinical Impression  Pt is a 52 y.o male s/p lumbar fusion. Pt moving well today. Has incrased pain 7/10. Reported being "swimmy headed" with initial standing. Progressed from supervsion to mod I with ambulation. Will assess stair ambulation tomorrow. Pt close to functioning at baseline.     PT Assessment  Patient needs continued PT services    Follow Up Recommendations  No PT follow up (Recommend outpatient PT when medically cleared by MD. )    Does the patient have the potential to tolerate intense rehabilitation      Barriers to Discharge        Equipment Recommendations  None recommended by PT    Recommendations for Other Services     Frequency Min 5X/week    Precautions / Restrictions Precautions Precautions: Back;Fall Precaution Booklet Issued: Yes (comment) Precaution Comments: Pt able to verbalize back precautions.  Required Braces or Orthoses: Spinal Brace Spinal Brace: Lumbar corset;Applied in sitting position Restrictions Weight Bearing Restrictions: No   Pertinent Vitals/Pain 7/10. Premedicated.      Mobility  Bed Mobility Bed Mobility: Rolling Right;Right Sidelying to Sit;Sitting - Scoot to Edge of Bed Rolling Right: 5: Supervision Right Sidelying to Sit: 5: Supervision (with handrails ) Sitting - Scoot to Edge of Bed: 5: Supervision Details for Bed Mobility Assistance: Required cues for safety and back precautions. Transfers Transfers: Sit to Stand;Stand to Sit Sit to Stand: 4: Min guard;From bed;From chair/3-in-1 Stand to Sit: 4: Min guard;With upper extremity assist;With armrests;To chair/3-in-1 Details for Transfer Assistance: Required cues for safety and back precautions to limit bending.  Ambulation/Gait Ambulation/Gait Assistance: 5:  Supervision Ambulation Distance (Feet): 200 Feet Assistive device: None Ambulation/Gait Assistance Details: Pt progressed from supervision to mod I with ambulation. Pt was "swimmy headed" at beginning but it cleared with ambulation.  Gait Pattern: Wide base of support Stairs: No Wheelchair Mobility Wheelchair Mobility: No    Exercises     PT Diagnosis: Acute pain  PT Problem List: Pain;Decreased mobility;Decreased knowledge of use of DME;Decreased safety awareness;Decreased knowledge of precautions PT Treatment Interventions: DME instruction;Gait training;Stair training;Functional mobility training;Patient/family education   PT Goals Acute Rehab PT Goals PT Goal Formulation: With patient Time For Goal Achievement: 03/31/12 Potential to Achieve Goals: Good Pt will Roll Supine to Right Side: with modified independence PT Goal: Rolling Supine to Right Side - Progress: Goal set today Pt will go Supine/Side to Sit: with modified independence PT Goal: Supine/Side to Sit - Progress: Goal set today Pt will go Sit to Stand: with modified independence PT Goal: Sit to Stand - Progress: Goal set today Pt will Transfer Bed to Chair/Chair to Bed: with modified independence PT Transfer Goal: Bed to Chair/Chair to Bed - Progress: Goal set today Pt will Ambulate: >150 feet;with modified independence PT Goal: Ambulate - Progress: Goal set today Pt will Go Up / Down Stairs: 3-5 stairs;with supervision PT Goal: Up/Down Stairs - Progress: Goal set today Additional Goals Additional Goal #1: Pt able to verbalize and folow back precautions.  PT Goal: Additional Goal #1 - Progress: Goal set today  Visit Information  Last PT Received On: 03/24/12 Assistance Needed: +1 PT/OT Co-Evaluation/Treatment: Yes    Subjective Data  Subjective: Pt reports pain 7/10 in back. Says he has felt swimmy headed.  Patient Stated Goal: Return home.    Prior  Functioning  Home Living Lives With:  Spouse;Daughter Available Help at Discharge: Family Type of Home: House Home Access: Stairs to enter Secretary/administrator of Steps: 3 Entrance Stairs-Rails: None Home Layout: One level Bathroom Shower/Tub: Engineer, manufacturing systems: Standard Bathroom Accessibility: Yes Home Adaptive Equipment: Bedside commode/3-in-1 Prior Function Level of Independence: Independent Able to Take Stairs?: Yes Driving: Yes Communication Communication: No difficulties Dominant Hand: Right    Cognition  Cognition Overall Cognitive Status: Appears within functional limits for tasks assessed/performed Arousal/Alertness: Awake/alert Orientation Level: Oriented X4 / Intact Behavior During Session: [facility] for tasks performed    Extremity/Trunk Assessment Right Lower Extremity Assessment RLE ROM/Strength/Tone: WFL for tasks assessed RLE Sensation: WFL - Light Touch Left Lower Extremity Assessment LLE ROM/Strength/Tone: WFL for tasks assessed LLE Sensation: WFL - Light Touch   Balance Balance Balance Assessed: No  End of Session PT - End of Session Equipment Utilized During Treatment: Gait belt;Back brace Activity Tolerance: Patient tolerated treatment well Patient left: in chair;with call bell/phone within reach;with family/visitor present Nurse Communication: Mobility status  GP    [place], [place], PT [phone] Sunny Schlein, [place] [phone] 03/24/2012, 10:22 AM

## 2012-03-24 NOTE — Progress Notes (Signed)
    Subjective: Procedure(s) (LRB): ANTERIOR LATERAL LUMBAR FUSION 1 LEVEL (N/A) 1 Day Post-Op  Patient reports pain as 3 on 0-10 scale.  Reports decreased leg pain reports incisional back pain   Positive void Negative bowel movement Negative flatus Negative chest pain or shortness of breath  Objective: Vital signs in last 24 hours: Temp:  [97.3 F (36.3 C)-97.9 F (36.6 C)] 97.9 F (36.6 C) (02/28 0610) Pulse Rate:  [50-63] 62 (02/28 0610) Resp:  [5-18] 18 (02/28 0610) BP: (108-125)/(56-82) 110/65 mmHg (02/28 0610) SpO2:  [97 %-100 %] 100 % (02/28 0610)  Intake/Output from previous day: 02/27 0701 - 02/28 0700 In: 2850 [I.V.:2850] Out: 2200 [Urine:2000; Blood:200]  Labs:  Recent Labs  03/21/12 1615  WBC 9.4  RBC 5.31  HCT 44.7  PLT 239    Recent Labs  03/21/12 1615  NA 137  K 4.0  CL 100  CO2 26  BUN 15  CREATININE 0.92  GLUCOSE 88  CALCIUM 9.7    Recent Labs  03/21/12 1615  INR 1.01    Physical Exam: Neurologically intact ABD soft Neurovascular intact Sensation intact distally Intact pulses distally Dorsiflexion/Plantar flexion intact Incision: dressing C/D/I and no drainage Compartment soft  Assessment/Plan: Patient stable  xrays pending Continue mobilization with physical therapy Continue care  Advance diet Up with therapy D/C IV fluids Plan for discharge tomorrow  Venita Lick, MD [facility] [phone]

## 2012-03-24 NOTE — Plan of Care (Signed)
Problem: Phase II Progression Outcomes Goal: Discharge plan established Recommend 3 in 1, tub bench and ADL A/E kit and HH OT for ADL trg and ADL mobility safety trg after acute care d/c

## 2012-03-25 MED ORDER — HYDROCODONE-ACETAMINOPHEN 10-325 MG PO TABS: 1.0000 | ORAL_TABLET | Freq: Four times a day (QID) | ORAL | Status: DC | PRN

## 2012-03-25 MED ORDER — DOCUSATE SODIUM 100 MG PO CAPS: 100.0000 mg | ORAL_CAPSULE | Freq: Three times a day (TID) | ORAL | Status: DC | PRN

## 2012-03-25 MED ORDER — POLYETHYLENE GLYCOL 3350 17 GM/SCOOP PO POWD: 17.0000 g | Freq: Every day | ORAL | Status: DC

## 2012-03-25 MED ORDER — METHOCARBAMOL 500 MG PO TABS: 500.0000 mg | ORAL_TABLET | Freq: Three times a day (TID) | ORAL | Status: DC | PRN

## 2012-03-25 MED ORDER — ONDANSETRON HCL 4 MG PO TABS: 4.0000 mg | ORAL_TABLET | Freq: Three times a day (TID) | ORAL | Status: DC | PRN

## 2012-03-25 NOTE — Discharge Summary (Signed)
Patient ID: JAQUIL TODT MRN: [id number] DOB/AGE: [birth year] 52 y.o.  Admit date: 03/23/2012 Discharge date: 03/25/2012  Admission Diagnoses:  Active Problems:   * No active hospital problems. *   Discharge Diagnoses:  Active Problems:   * No active hospital problems. *  status post Procedure(s): ANTERIOR LATERAL LUMBAR FUSION 1 LEVEL  Past Medical History  Diagnosis Date  . Hypertension     takes Metoprolol daily  . Hyperlipidemia     takes Zetia and Lipitor daily  . Peripheral edema     takes Furosemide daily  . Coronary artery disease   . Myocardial infarction     x 3   . Anginal pain     noticed some chest discomfort today   . Seasonal allergies     takes Claritin D prn  . History of bronchitis     Jan 2014  . Chronic back pain   . Bruises easily   . GERD (gastroesophageal reflux disease)     takes Prilosec daily  . History of colon polyps   . Anxiety     takes Alprazolam daily  . PONV (postoperative nausea and vomiting)     states takes alot to get him to sleep    Surgeries: Procedure(s): ANTERIOR LATERAL LUMBAR FUSION 1 LEVEL on 03/23/2012   Consultants:  none  Discharged Condition: Improved  Hospital Course: CHARLIES RAYBURN is an 52 y.o. male who was admitted 03/23/2012 for operative treatment of Spinal stenosis of lumbar region. Patient failed conservative treatments (please see the history and physical for the specifics) and had severe unremitting pain that affects sleep, daily activities and work/hobbies. After pre-op clearance, the patient was taken to the operating room on 03/23/2012 and underwent  Procedure(s): ANTERIOR LATERAL LUMBAR FUSION 1 LEVEL.    Patient was given perioperative antibiotics: Anti-infectives   Start     Dose/Rate Route Frequency Ordered Stop   03/23/12 1600  ceFAZolin (ANCEF) IVPB 1 g/50 mL premix     1 g 100 mL/hr over 30 Minutes Intravenous Every 8 hours 03/23/12 1548 03/24/12 0200   03/22/12 1436  ceFAZolin (ANCEF) IVPB  2 g/50 mL premix     2 g 100 mL/hr over 30 Minutes Intravenous 30 min pre-op 03/22/12 1436 03/23/12 0740       Patient was given sequential compression devices and early ambulation to prevent DVT.   Patient benefited maximally from hospital stay and there were no complications. At the time of discharge, the patient was urinating/moving their bowels without difficulty, tolerating a regular diet, pain is controlled with oral pain medications and they have been cleared by PT/OT.   Recent vital signs: Patient Vitals for the past 24 hrs:  BP Temp Temp src Pulse Resp SpO2  03/25/12 0559 118/62 mmHg 97.8 F (36.6 C) - 60 16 99 %  03/24/12 2128 130/57 mmHg 98.2 F (36.8 C) - 64 18 96 %  03/24/12 1430 121/53 mmHg 97.2 F (36.2 C) - 63 18 95 %  03/24/12 1015 117/52 mmHg 98 F (36.7 C) Oral 66 18 97 %     Recent laboratory studies: No results found for this basename: WBC, HGB, HCT, PLT, NA, K, CL, CO2, BUN, CREATININE, GLUCOSE, PT, INR, CALCIUM, 2,  in the last 72 hours   Discharge Medications:     Medication List    STOP taking these medications       ibuprofen 200 MG tablet  Commonly known as:  ADVIL,MOTRIN  TAKE these medications       albuterol 108 (90 BASE) MCG/ACT inhaler  Commonly known as:  PROVENTIL HFA;VENTOLIN HFA  Inhale 2 puffs into the lungs every 6 (six) hours as needed for wheezing.     ALPRAZolam 0.5 MG tablet  Commonly known as:  XANAX  Take 0.5 mg by mouth 3 (three) times daily as needed for anxiety.     aspirin 325 MG tablet  Take 325 mg by mouth daily.     clopidogrel 75 MG tablet  Commonly known as:  PLAVIX  Take 75 mg by mouth daily.     docusate sodium 100 MG capsule  Commonly known as:  COLACE  Take 1 capsule (100 mg total) by mouth 3 (three) times daily as needed for constipation.     ezetimibe 10 MG tablet  Commonly known as:  ZETIA  Take 10 mg by mouth daily.     HYDROcodone-acetaminophen 10-325 MG per tablet  Commonly known as:  NORCO   Take 1 tablet by mouth every 6 (six) hours as needed for pain.     lisinopril 10 MG tablet  Commonly known as:  PRINIVIL,ZESTRIL  Take 10 mg by mouth daily.     loratadine-pseudoephedrine 5-120 MG per tablet  Commonly known as:  CLARITIN-D 12-hour  Take 1 tablet by mouth 2 (two) times daily as needed for allergies.     methocarbamol 500 MG tablet  Commonly known as:  ROBAXIN  Take 1 tablet (500 mg total) by mouth 3 (three) times daily as needed.     metoprolol 100 MG tablet  Commonly known as:  LOPRESSOR  Take 150 mg by mouth 2 (two) times daily.     omeprazole 20 MG capsule  Commonly known as:  PRILOSEC  Take 20 mg by mouth daily.     ondansetron 4 MG tablet  Commonly known as:  ZOFRAN  Take 1 tablet (4 mg total) by mouth every 8 (eight) hours as needed for nausea.     polyethylene glycol powder powder  Commonly known as:  GLYCOLAX  Take 17 g by mouth daily.     ranolazine 500 MG 12 hr tablet  Commonly known as:  RANEXA  Take 1,000 mg by mouth 2 (two) times daily.     rosuvastatin 20 MG tablet  Commonly known as:  CRESTOR  Take 20 mg by mouth daily.      ASK your doctor about these medications       furosemide 20 MG tablet  Commonly known as:  LASIX  Take 20 mg by mouth daily.        Diagnostic Studies: Dg Chest 2 View  03/21/2012  *RADIOLOGY REPORT*  Clinical Data: Preop for lumbar surgery.  CHEST - 2 VIEW  Comparison: 11/22/2011.  Findings: Stable surgical changes from bypass surgery.  The cardiac silhouette, mediastinal and hilar contours are within normal limits and stable.  The lungs are clear.  No pleural effusion.  The bony thorax is intact.  IMPRESSION: No acute cardiopulmonary findings.   Original Report Authenticated By: Rudie Meyer, M.D.    Dg Lumbar Spine 2-3 Views  03/24/2012  *RADIOLOGY REPORT*  Clinical Data: Status post lumbar fusion  LUMBAR SPINE - 2-3 VIEW  Comparison: Intraoperative fluoroscopic images dated 03/23/2012  Findings: Left  unilateral posterior fusion at L3-4.  Associated interbody spacer.  Normal alignment.  Mild multilevel degenerative changes.  Vascular calcifications.  IMPRESSION: Status post left XLIF at L3-4.   Original Report Authenticated By: Lurlean Horns  Rito Ehrlich, M.D.    Dg Lumbar Spine 2-3 Views  03/23/2012  *RADIOLOGY REPORT*  Clinical Data: FUSION L3-4.  DG C-ARM GT 120 MIN,LUMBAR SPINE - 2-3 VIEW  Comparison: 03/21/2012  Findings: Two intraoperative spot images demonstrate left unilateral posterior fusion changes at L3-4.  Normal alignment.  No hardware or bony complicating feature.  IMPRESSION: L3-4 posterior fusion.  No complicating feature.   Original Report Authenticated By: Charlett Nose, M.D.    Dg Lumbar Spine 2-3 Views  03/21/2012  *RADIOLOGY REPORT*  Clinical Data: Pre admission for lumbar surgery.  LUMBAR SPINE - 2-3 VIEW  Comparison: MRI lumbar spine 08/07/2011.  Findings: There are five non-rib bearing lumbar type vertebral bodies.  The alignment is normal.  Disc spaces are preserved.  No acute bony findings.  The visualized bony pelvis is intact. Moderate vascular calcifications.  IMPRESSION: Normal alignment and no acute bony findings.   Original Report Authenticated By: Rudie Meyer, M.D.    Dg C-arm Gt 120 Min  03/23/2012  *RADIOLOGY REPORT*  Clinical Data: FUSION L3-4.  DG C-ARM GT 120 MIN,LUMBAR SPINE - 2-3 VIEW  Comparison: 03/21/2012  Findings: Two intraoperative spot images demonstrate left unilateral posterior fusion changes at L3-4.  Normal alignment.  No hardware or bony complicating feature.  IMPRESSION: L3-4 posterior fusion.  No complicating feature.   Original Report Authenticated By: Charlett Nose, M.D.           Follow-up Information   Follow up with Alvy Beal, MD. Call in 2 weeks. (As needed if symptoms worsen)    Contact information:   [address], [address] [place] [address] [phone]       Discharge Plan:  discharge to  home  Disposition:  stable   Signed: Venita Lick D for Dr. Venita Lick [facility] [phone] 03/25/2012, 9:32 AM

## 2012-03-25 NOTE — Progress Notes (Signed)
    Subjective: Procedure(s) (LRB): ANTERIOR LATERAL LUMBAR FUSION 1 LEVEL (N/A) 2 Days Post-Op  Patient reports pain as 2 on 0-10 scale.  Reports decreased leg pain reports incisional back pain   Positive void Negative bowel movement Positive flatus Negative chest pain or shortness of breath  Objective: Vital signs in last 24 hours: Temp:  [97.2 F (36.2 C)-98.2 F (36.8 C)] 97.8 F (36.6 C) (03/01 0559) Pulse Rate:  [60-66] 60 (03/01 0559) Resp:  [16-18] 16 (03/01 0559) BP: (117-130)/(52-62) 118/62 mmHg (03/01 0559) SpO2:  [95 %-99 %] 99 % (03/01 0559)  Intake/Output from previous day: 02/28 0701 - 03/01 0700 In: 240 [P.O.:240] Out: 1000 [Urine:1000]  Labs: No results found for this basename: WBC, RBC, HCT, PLT,  in the last 72 hours No results found for this basename: NA, K, CL, CO2, BUN, CREATININE, GLUCOSE, CALCIUM,  in the last 72 hours No results found for this basename: LABPT, INR,  in the last 72 hours  Physical Exam: Neurologically intact ABD soft Neurovascular intact Intact pulses distally Dorsiflexion/Plantar flexion intact No cellulitis present Compartment soft Incision C/D/I  Assessment/Plan: Patient stable  xrays satisfactory Continue mobilization with physical therapy Continue care  Advance diet Up with therapy D/c to home  Venita Lick, MD [facility] [phone]

## 2012-03-25 NOTE — Progress Notes (Addendum)
   CARE MANAGEMENT NOTE 03/25/2012  Patient:  Patrick Goodwin, Patrick Goodwin   Account Number:  [id number]  Date Initiated:  03/25/2012  Documentation initiated by:  [facility]  Subjective/Objective Assessment:   ANTERIOR LATERAL LUMBAR FUSION 1 LEVEL     Action/Plan:   Anticipated DC Date:  03/25/2012   Anticipated DC Plan:  HOME/SELF CARE      DC Planning Services  CM consult  Outpatient Services - Pt will follow up      Choice offered to / List presented to:             Status of service:  Completed, signed off Medicare Important Message given?   (If response is "NO", the following Medicare IM given date fields will be blank) Date Medicare IM given:   Date Additional Medicare IM given:    Discharge Disposition:  HOME/SELF CARE  Per UR Regulation:    If discussed at Long Length of Stay Meetings, dates discussed:    Comments:  03/25/2012 3:04 PM   NCM spoke to pt and explained Home OT cannot be scheduled without other skilled service. Pt states at this time he can do without Home OT or outpt OT. States he will follow up with surgeon on next week if he wants to do outpt OT. Isidoro Donning RN CCM Case Mgmt phone [phone]

## 2012-03-25 NOTE — Progress Notes (Signed)
Occupational Therapy Treatment Patient Details Name: Patrick Goodwin MRN: [id number] DOB: Feb 11, 1960 Today's Date: 03/25/2012 Time: 4098-1191 OT Time Calculation (min): 12 min  OT Assessment / Plan / Recommendation Comments on Treatment Session      Follow Up Recommendations  Home health OT    Barriers to Discharge       Equipment Recommendations   (family states they have all equipment at home)    Recommendations for Other Services    Frequency Min 2X/week   Plan Discharge plan remains appropriate    Precautions / Restrictions Precautions Precautions: Back;Fall Precaution Booklet Issued: Yes (comment) Precaution Comments: able to state 3/3 precautions Required Braces or Orthoses: Spinal Brace Spinal Brace: Lumbar corset;Applied in sitting position Restrictions Weight Bearing Restrictions: No   Pertinent Vitals/Pain 2/10 per pt.    ADL  Tub/Shower Transfer: Simulated;Min guard Tub/Shower Transfer Method: Ecologist with back Transfers/Ambulation Related to ADLs: pt. able to side step over simulated tub ledge for tub/shower transfer ADL Comments: pt. wife state he will have all needed a/e and dme from various family members. declined practice with a/e stating they know how to use it. max. encouragement for sim tub transfer in room to demonstrate ability to step over    OT Diagnosis:    OT Problem List:   OT Treatment Interventions:     OT Goals ADL Goals ADL Goal: Tub/Shower Transfer - Progress: Progressing toward goals  Visit Information  Last OT Received On: 03/25/12    Subjective Data  Subjective: "im going home today"   Prior Functioning       Cognition  Cognition Overall Cognitive Status: Appears within functional limits for tasks assessed/performed Arousal/Alertness: Awake/alert Orientation Level: Appears intact for tasks assessed Behavior During Session: [facility] for tasks performed    Mobility   Transfers Transfers: Sit to Stand;Stand to Sit Sit to Stand: 5: Supervision;With upper extremity assist;With armrests;From chair/3-in-1;From bed Stand to Sit: 5: Supervision;With upper extremity assist;With armrests;To bed;To chair/3-in-1    Exercises      Balance     End of Session OT - End of Session Activity Tolerance: Patient tolerated treatment well Patient left: in chair;with family/visitor present;with call bell/phone within reach  GO     Robet Leu 03/25/2012, 12:19 PM

## 2012-03-25 NOTE — Progress Notes (Signed)
Physical Therapy Treatment Patient Details Name: Patrick Goodwin MRN: [id number] DOB: Mar 25, 1960 Today's Date: 03/25/2012 Time: 8295-6213 PT Time Calculation (min): 13 min  PT Assessment / Plan / Recommendation Comments on Treatment Session  Pt making great progress toward goals.     Follow Up Recommendations  No PT follow up           Equipment Recommendations  None recommended by PT       Frequency Min 5X/week   Plan Discharge plan remains appropriate;Frequency remains appropriate    Precautions / Restrictions Precautions Precautions: Back;Fall Precaution Booklet Issued: Yes (comment) Precaution Comments: able to state 3/3 precautions Required Braces or Orthoses: Spinal Brace Spinal Brace: Lumbar corset;Applied in sitting position Restrictions Weight Bearing Restrictions: No       Mobility  Bed Mobility Bed Mobility: Rolling Left;Left Sidelying to Sit Rolling Left: 6: Modified independent (Device/Increase time) Left Sidelying to Sit: 6: Modified independent (Device/Increase time);HOB flat Details for Bed Mobility Assistance: bed flat and no rails used. no cues or assitance needed as well. Transfers Sit to Stand: 5: Supervision;With upper extremity assist;With armrests;From chair/3-in-1;From bed Stand to Sit: 5: Supervision;With upper extremity assist;With armrests;To bed;To chair/3-in-1 Details for Transfer Assistance: no cues or assistance needed Ambulation/Gait Ambulation/Gait Assistance: 6: Modified independent (Device/Increase time) Ambulation Distance (Feet): 400 Feet Assistive device: None Gait Pattern: Within Functional Limits Stairs: Yes Stairs Assistance: 4: Min guard;5: Supervision Stairs Assistance Details (indicate cue type and reason): cues for seqence with stairs both with and without use of rails Stair Management Technique: No rails;Two rails;Alternating pattern;Forwards Number of Stairs: 5 (5 with rails, 5 without rails)      PT Goals Acute Rehab  PT Goals PT Goal: Supine/Side to Sit - Progress: Met PT Goal: Sit to Stand - Progress: Met PT Transfer Goal: Bed to Chair/Chair to Bed - Progress: Met PT Goal: Ambulate - Progress: Met PT Goal: Up/Down Stairs - Progress: Met Additional Goals PT Goal: Additional Goal #1 - Progress: Met  Visit Information  Last PT Received On: 03/25/12 Assistance Needed: +1    Subjective Data  Subjective: Reports 6/10 pain currently. Agreeable to therapy at this time. No new complaints.   Cognition  Cognition Overall Cognitive Status: Appears within functional limits for tasks assessed/performed Arousal/Alertness: Awake/alert Orientation Level: Appears intact for tasks assessed Behavior During Session: [facility] for tasks performed       End of Session PT - End of Session Equipment Utilized During Treatment: Gait belt;Back brace Activity Tolerance: Patient tolerated treatment well Patient left: in bed;with nursing in room;with family/visitor present Nurse Communication: Mobility status   GP     Sallyanne Kuster 03/25/2012, 12:29 PM  Sallyanne Kuster, PTA Office- [phone]

## 2012-03-26 ENCOUNTER — Encounter (HOSPITAL_COMMUNITY): Payer: Self-pay | Admitting: Orthopedic Surgery

## 2014-08-28 DIAGNOSIS — I493 Ventricular premature depolarization: Secondary | ICD-10-CM

## 2014-08-28 DIAGNOSIS — I25119 Atherosclerotic heart disease of native coronary artery with unspecified angina pectoris: Secondary | ICD-10-CM

## 2014-08-28 DIAGNOSIS — I10 Essential (primary) hypertension: Secondary | ICD-10-CM

## 2014-08-28 DIAGNOSIS — E785 Hyperlipidemia, unspecified: Secondary | ICD-10-CM | POA: Insufficient documentation

## 2014-08-28 HISTORY — DX: Ventricular premature depolarization: I49.3

## 2014-08-28 HISTORY — DX: Atherosclerotic heart disease of native coronary artery with unspecified angina pectoris: I25.119

## 2014-08-28 HISTORY — DX: Essential (primary) hypertension: I10

## 2015-04-18 DIAGNOSIS — Z951 Presence of aortocoronary bypass graft: Secondary | ICD-10-CM

## 2015-04-18 HISTORY — DX: Presence of aortocoronary bypass graft: Z95.1

## 2015-04-30 DIAGNOSIS — R079 Chest pain, unspecified: Secondary | ICD-10-CM

## 2015-04-30 HISTORY — DX: Chest pain, unspecified: R07.9

## 2016-07-23 ENCOUNTER — Encounter: Payer: Self-pay | Admitting: Cardiology

## 2016-08-04 ENCOUNTER — Ambulatory Visit: Payer: Commercial Managed Care - PPO | Admitting: Cardiology

## 2016-08-05 ENCOUNTER — Other Ambulatory Visit: Payer: Self-pay

## 2016-08-11 ENCOUNTER — Ambulatory Visit (INDEPENDENT_AMBULATORY_CARE_PROVIDER_SITE_OTHER): Payer: Commercial Managed Care - PPO | Admitting: Cardiology

## 2016-08-11 ENCOUNTER — Encounter: Payer: Self-pay | Admitting: Cardiology

## 2016-08-11 VITALS — BP 124/82 | HR 59 | Ht 67.0 in | Wt 216.0 lb

## 2016-08-11 DIAGNOSIS — I493 Ventricular premature depolarization: Secondary | ICD-10-CM

## 2016-08-11 DIAGNOSIS — I25119 Atherosclerotic heart disease of native coronary artery with unspecified angina pectoris: Secondary | ICD-10-CM | POA: Diagnosis not present

## 2016-08-11 DIAGNOSIS — E785 Hyperlipidemia, unspecified: Secondary | ICD-10-CM | POA: Diagnosis not present

## 2016-08-11 DIAGNOSIS — I10 Essential (primary) hypertension: Secondary | ICD-10-CM

## 2016-08-11 NOTE — Progress Notes (Signed)
Cardiology Office Note:    Date:  08/11/2016   ID:  Patrick Goodwin, DOB [birth year], MRN [id number]  PCP:  Paulina Fusi, MD  Cardiologist:  Norman Herrlich, MD  Please do a CMP lipids and Magnesium  Referring MD: Paulina Fusi, MD    ASSESSMENT:    1. Frequent PVCs   2. Coronary artery disease involving native coronary artery of native heart with angina pectoris (HCC)   3. Essential hypertension   4. Hyperlipidemia, unspecified hyperlipidemia type    PLAN:    In order of problems listed above:  1. Improved with his beta blocker tolerated no PVCs on his EKG. I discussed the potential of EP referral for consideration of PVC focus ablation however he does not wish to proceed at this time. I asked him if he wanted to reconsider to contact me we do a Holter monitor 48 hours to assure that it is a single focus and a high high PVC burden.Marland Kitchen He'll continue to avoid over-the-counter proarrhythmic drugs and is scheduled for labs with his PCP to include renal function potassium and magnesium. 2. Stable continue current medical treatment with long-term dual antiplatelet therapy beta blocker and high intensity statin. 3. Stable continue current treatment including ACE inhibitor he'll have his renal function potassium rechecked 4. Stable continue his statin, I don't think he is having muscle toxicity from statin he will have his liver function checked and LDL for target less than 70.   Next appointment: 6months   Medication Adjustments/Labs and Tests Ordered: Current medicines are reviewed at length with the patient today.  Concerns regarding medicines are outlined above.  No orders of the defined types were placed in this encounter.  No orders of the defined types were placed in this encounter.   Chief Complaint  Patient presents with  . Coronary Artery Disease    History of Present Illness:    Patrick Goodwin is a 56 y.o. male with a hx of CAD, Dyslipidemia, HTN, S/P CABG and  symptomatic PVC's last seen 3 months ago.. Compliance with diet, lifestyle and medications: Yes  Is PVCs and palpitation or improved with the beta blocker but he finds himself fatigue and struggles for work. 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 He acknowledges that his predominant limitation in his leg fatigue and back pain which is worsened and he is spinal stenosis. He is considering applying for Social Security  Disability Past Medical History:  Diagnosis Date  . Anginal pain (HCC)    noticed some chest discomfort today   . Anxiety    takes Alprazolam daily  . Bruises easily   . Chest pain 04/30/2015  . Chronic back pain   . Coronary artery disease   . Coronary artery disease involving native coronary artery with angina pectoris (HCC) 08/28/2014   Overview:  Conclusions 04/30/15: Diagnostic Procedure Summary Multivessel CAD. No change from prior study. All grafts are widely patent No angiographic culprit PCI target  . Essential hypertension 08/28/2014  . 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 GERD (gastroesophageal reflux disease)    takes Prilosec daily  . History of bronchitis    Jan 2014  . History of colon polyps   . Hx of CABG 04/18/2015  . Hyperlipidemia    takes Zetia and Lipitor daily  . Hypertension    takes Metoprolol daily  . Myocardial infarction (HCC)    x 3   .  Peripheral edema    takes Furosemide daily  . PONV (postoperative nausea and vomiting)    states takes alot to get him to sleep  . Seasonal allergies    takes Claritin D prn    Past Surgical History:  Procedure Laterality Date  . ANTERIOR LAT LUMBAR FUSION N/A 03/23/2012   Procedure: ANTERIOR LATERAL LUMBAR FUSION 1 LEVEL;  Surgeon: Venita Lickahari Brooks, MD;  Location: [facility] OR;  Service: Orthopedics;  Laterality: N/A;  XLIF L3-4 WITH POSTERIOR SPINAL FUSION INTERBODY L3-4  . CARDIAC CATHETERIZATION  2001  . COLONOSCOPY    . CORONARY ANGIOPLASTY     3-4 stents  placed  . CORONARY ARTERY BYPASS GRAFT  1994   x 4    Current Medications: Current Meds  Medication Sig  . acebutolol (SECTRAL) 200 MG capsule Take 200 mg by mouth 2 (two) times daily.  Marland Kitchen. ALPRAZolam (XANAX) 0.5 MG tablet Take 0.5 mg by mouth 3 (three) times daily as needed for anxiety.  Marland Kitchen. aspirin 325 MG tablet Take 325 mg by mouth daily.  . clopidogrel (PLAVIX) 75 MG tablet Take 75 mg by mouth daily.  Marland Kitchen. ezetimibe (ZETIA) 10 MG tablet Take 10 mg by mouth daily.  . furosemide (LASIX) 20 MG tablet Take 20 mg by mouth daily.  Marland Kitchen. lisinopril (PRINIVIL,ZESTRIL) 10 MG tablet Take 10 mg by mouth daily.  Marland Kitchen. omeprazole (PRILOSEC) 20 MG capsule Take 20 mg by mouth daily.  . ranolazine (RANEXA) 1000 MG SR tablet Take 1,000 mg by mouth 2 (two) times daily.  . rosuvastatin (CRESTOR) 20 MG tablet Take 20 mg by mouth daily.     Allergies:   Imdur [isosorbide nitrate]   Social History   Social History  . Marital status: Married    Spouse name: N/A  . Number of children: N/A  . Years of education: N/A   Social History Main Topics  . Smoking status: Former Games developermoker  . Smokeless tobacco: Never Used     Comment: quit smoking 12+yrs ago  . Alcohol use No  . Drug use: No  . Sexual activity: Yes   Other Topics Concern  . None   Social History Narrative  . None     Family History: The patient's family history includes CAD in his mother; Heart attack in his father. ROS:   Please see the history of present illness.    All other systems reviewed and are negative.  EKGs/Labs/Other Studies Reviewed:    The following studies were reviewed today:  EKG:  EKG ordered today.  [facility]  Recent Labs: No results found for requested labs within last 8760 hours.  Recent Lipid Panel No results found for: CHOL, TRIG, HDL, CHOLHDL, VLDL, LDLCALC, LDLDIRECT  Physical Exam:    VS:  BP 124/82   Pulse (!) 59   Ht 5\' 7"  (1.702 m)   Wt 216 lb (98 kg)   SpO2 97%    BMI 33.83 kg/m     Wt Readings from Last 3 Encounters:  08/11/16 216 lb (98 kg)  03/21/12 219 lb 4.8 oz (99.5 kg)     GEN:  Well nourished, well developed in no acute distress HEENT: Normal NECK: No JVD; No carotid bruits LYMPHATICS: No lymphadenopathy CARDIAC: RRR, no murmurs, rubs, gallops RESPIRATORY:  Clear to auscultation without rales, wheezing or rhonchi  ABDOMEN: Soft, non-tender, non-distended MUSCULOSKELETAL:  No edema; No deformity  SKIN: Warm and dry NEUROLOGIC:  Alert and oriented x 3 PSYCHIATRIC:  Normal affect    Signed, Norman Herrlich, MD  08/11/2016 11:53 AM    [facility]

## 2016-08-11 NOTE — Patient Instructions (Signed)

## 2016-10-04 ENCOUNTER — Other Ambulatory Visit: Payer: Self-pay

## 2016-10-04 DIAGNOSIS — I25119 Atherosclerotic heart disease of native coronary artery with unspecified angina pectoris: Secondary | ICD-10-CM

## 2016-10-04 NOTE — Progress Notes (Signed)
Patient seen 08/11/16. 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 Lexsiscan myoview ordered per Dr. Dulce SellarMunley.

## 2016-10-05 ENCOUNTER — Telehealth: Payer: Self-pay

## 2016-10-05 NOTE — Telephone Encounter (Signed)
l/m informing patient of appt @ [facility] for lexiscan 10/11/16 checkin at 8:30.cn

## 2016-10-06 NOTE — Telephone Encounter (Signed)
Patient given instructions for nothing to eat or drink after midnight Sunday night. Hold acebutolol 24 hours prior to test. No perfume, cologne, or lotion. Patient verbalized understanding and will return call with any further questions.

## 2016-10-06 NOTE — Telephone Encounter (Signed)
Patient returned call and informed of the appointment date and time.

## 2016-10-11 ENCOUNTER — Telehealth: Payer: Self-pay

## 2016-10-11 DIAGNOSIS — I25119 Atherosclerotic heart disease of native coronary artery with unspecified angina pectoris: Secondary | ICD-10-CM | POA: Diagnosis not present

## 2016-10-11 NOTE — Telephone Encounter (Signed)
Please fax dot papers to Urgent Care once completed at [phone].cn

## 2016-10-12 NOTE — Telephone Encounter (Signed)
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

## 2016-10-14 ENCOUNTER — Ambulatory Visit: Payer: Commercial Managed Care - PPO | Admitting: Cardiology

## 2017-09-27 NOTE — Progress Notes (Signed)
Cardiology Office Note:    Date:  09/28/2017   ID:  Patrick Goodwin, DOB December 03, 1960, MRN [id number]  PCP:  Paulina Fusi, MD  Cardiologist:  Norman Herrlich, MD    Referring MD: Paulina Fusi, MD    ASSESSMENT:    1. Coronary artery disease involving native coronary artery of native heart with angina pectoris (HCC)   2. Chest pain, unspecified type    PLAN:    In order of problems listed above:  1. 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  If he has high risk markers would benefit from further revascularization coronary angiography. 2. Hyperlipidemia stable continue combined treatment high intensity statin and Zetia 3. Hypertension stable continue current treatment including lisinopril 4. Symptomatic PVCs and will use a beta-blocker PRN for symptom control   Next appointment:   Medication Adjustments/Labs and Tests Ordered: Current medicines are reviewed at length with the patient today.  Concerns regarding medicines are outlined above.  Orders Placed This Encounter  Procedures  . EKG 12-Lead   Meds ordered this encounter  Medications  . acebutolol (SECTRAL) 200 MG capsule    Sig: Take 1 capsule (200 mg total) by mouth daily as needed. For bothersome palpitation    Dispense:  60 capsule    Refill:  29    Chief Complaint  Patient presents with  . 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 Hypertension  . Hyperlipidemia    History of Present Illness:    Patrick Goodwin is a 57 y.o. male with a hx of CAD, Dyslipidemia, HTN, S/P CABG and symptomatic PVC's   last seen one year ago.. Compliance with diet, lifestyle and medications: yes  His predominant problem is back pain and lower extremity weakness he has impending cervical spine surgery.  Despite this he continues to work no angina dyspnea orthopnea edema syncope or TIA.  He does have intermittent palpitation he has had symptomatic PVCs in the past did  not tolerate chronic beta-blocker bradycardia I told him he can take his beta-blocker on a as needed basis for symptom relief.  Several months ago he had a rest episode of angina relieved with 2 nitroglycerin no recurrence.  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 Anginal pain (HCC)    noticed some chest discomfort today   . Anxiety    takes Alprazolam daily  . Bruises easily   . Chest pain 04/30/2015  . Chronic back pain   . Coronary artery disease   . Coronary artery disease involving native coronary artery with angina pectoris (HCC) 08/28/2014   Overview:  Conclusions 04/30/15: Diagnostic Procedure Summary Multivessel CAD. No change from prior study. All grafts are widely patent No angiographic culprit PCI target  . Essential hypertension 08/28/2014  . 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 GERD (gastroesophageal reflux disease)    takes Prilosec daily  . History of bronchitis    Jan 2014  . History of colon polyps   . Hx of CABG 04/18/2015  . Hyperlipidemia    takes Zetia and Lipitor daily  . Hypertension    takes Metoprolol daily  . Myocardial infarction (HCC)    x 3   . Peripheral edema    takes Furosemide daily  . PONV (postoperative nausea and vomiting)    states takes alot to get him  to sleep  . Seasonal allergies    takes Claritin D prn    Past Surgical History:  Procedure Laterality Date  . ANTERIOR LAT LUMBAR FUSION N/A 03/23/2012   Procedure: ANTERIOR LATERAL LUMBAR FUSION 1 LEVEL;  Surgeon: Venita Lick, MD;  Location: [facility] OR;  Service: Orthopedics;  Laterality: N/A;  XLIF L3-4 WITH POSTERIOR SPINAL FUSION INTERBODY L3-4  . CARDIAC CATHETERIZATION  2001  . COLONOSCOPY    . CORONARY ANGIOPLASTY     3-4 stents placed  . CORONARY ARTERY BYPASS GRAFT  1994   x 4    Current Medications: Current Meds  Medication Sig  . ALPRAZolam (XANAX) 0.5 MG tablet Take  0.5 mg by mouth 3 (three) times daily as needed for anxiety.  . clopidogrel (PLAVIX) 75 MG tablet Take 75 mg by mouth daily.  Marland Kitchen ezetimibe (ZETIA) 10 MG tablet Take 10 mg by mouth daily.  . furosemide (LASIX) 20 MG tablet Take 20 mg by mouth daily.  Marland Kitchen lisinopril (PRINIVIL,ZESTRIL) 10 MG tablet Take 10 mg by mouth daily.  . nitroGLYCERIN (NITROLINGUAL) 0.4 MG/SPRAY spray Place 1 spray under the tongue every 5 (five) minutes x 3 doses as needed for chest pain.  Marland Kitchen omeprazole (PRILOSEC) 20 MG capsule Take 20 mg by mouth daily.  . ranolazine (RANEXA) 1000 MG SR tablet Take 1,000 mg by mouth 2 (two) times daily.  . rosuvastatin (CRESTOR) 20 MG tablet Take 20 mg by mouth daily.  . [DISCONTINUED] aspirin 325 MG tablet Take 325 mg by mouth daily.     Allergies:   Imdur [isosorbide nitrate]   Social History   Socioeconomic History  . Marital status: Married    Spouse name: Not on file  . Number of children: Not on file  . Years of education: Not on file  . Highest education level: Not on file  Occupational History  . Not on file  Social Needs  . Financial resource strain: Not on file  . Food insecurity:    Worry: Not on file    Inability: Not on file  . Transportation needs:    Medical: Not on file    Non-medical: Not on file  Tobacco Use  . Smoking status: Former Games developer  . Smokeless tobacco: Never Used  . Tobacco comment: quit smoking 12+yrs ago  Substance and Sexual Activity  . Alcohol use: No  . Drug use: No  . Sexual activity: Yes  Lifestyle  . Physical activity:    Days per week: Not on file    Minutes per session: Not on file  . Stress: Not on file  Relationships  . Social connections:    Talks on phone: Not on file    Gets together: Not on file    Attends religious service: Not on file    Active member of club or organization: Not on file    Attends meetings of clubs or organizations: Not on file    Relationship status: Not on file  Other Topics Concern  . Not on  file  Social History Narrative  . Not on file     Family History: The patient's family history includes CAD in his mother; Heart attack in his father. ROS:   Please see the history of present illness.    All other systems reviewed and are negative.  EKGs/Labs/Other Studies Reviewed:    The following studies were reviewed today:  EKG:  EKG ordered today.  The ekg ordered today demonstrates sinus rhythm no ischemic changes  Recent  Labs:   Recent labs from [facility] and shows a cholesterol 111 HDL 40 LDL 45 he is at targets creatinine TSH normal No results found for requested labs within last 8760 hours.  Recent Lipid Panel No results found for: CHOL, TRIG, HDL, CHOLHDL, VLDL, LDLCALC, LDLDIRECT  Physical Exam:    VS:  BP (!) 144/82 (BP Location: Right Arm, Patient Position: Sitting, Cuff Size: Large)   Pulse (!) 57   Ht 5\' 7"  (1.702 m)   Wt 222 lb 12.8 oz (101.1 kg)   SpO2 97%   BMI 34.90 kg/m     Wt Readings from Last 3 Encounters:  09/28/17 222 lb 12.8 oz (101.1 kg)  08/11/16 216 lb (98 kg)  03/21/12 219 lb 4.8 oz (99.5 kg)     GEN:  Well nourished, well developed in no acute distress HEENT: Normal NECK: No JVD; No carotid bruits LYMPHATICS: No lymphadenopathy CARDIAC: RRR, no murmurs, rubs, gallops RESPIRATORY:  Clear to auscultation without rales, wheezing or rhonchi  ABDOMEN: Soft, non-tender, non-distended MUSCULOSKELETAL:  No edema; No deformity  SKIN: Warm and dry NEUROLOGIC:  Alert and oriented x 3 PSYCHIATRIC:  Normal affect    Signed, Norman Herrlich, MD  09/28/2017 9:27 AM    [facility]

## 2017-09-28 ENCOUNTER — Encounter: Payer: Self-pay | Admitting: *Deleted

## 2017-09-28 ENCOUNTER — Encounter: Payer: Self-pay | Admitting: Cardiology

## 2017-09-28 ENCOUNTER — Telehealth (HOSPITAL_COMMUNITY): Payer: Self-pay

## 2017-09-28 ENCOUNTER — Ambulatory Visit: Payer: No Typology Code available for payment source | Admitting: Cardiology

## 2017-09-28 VITALS — BP 144/82 | HR 57 | Ht 67.0 in | Wt 222.8 lb

## 2017-09-28 DIAGNOSIS — R079 Chest pain, unspecified: Secondary | ICD-10-CM

## 2017-09-28 DIAGNOSIS — I25119 Atherosclerotic heart disease of native coronary artery with unspecified angina pectoris: Secondary | ICD-10-CM

## 2017-09-28 MED ORDER — ACEBUTOLOL HCL 200 MG PO CAPS: 200.0000 mg | ORAL_CAPSULE | Freq: Every day | ORAL | 29 refills | Status: DC | PRN

## 2017-09-28 MED ORDER — ASPIRIN EC 81 MG PO TBEC: 81.0000 mg | DELAYED_RELEASE_TABLET | Freq: Every day | ORAL | 3 refills | Status: AC

## 2017-09-28 NOTE — Patient Instructions (Addendum)
Medication Instructions:  Your physician has recommended you make the following change in your medication:  DECREASE aspirin 81 mg daily   CHANGE acebutolol (sectral) 200 mg daily as needed  Labwork: None  Testing/Procedures: You had an EKG today.   Your physician has requested that you have a lexiscan myoview. For further information please visit [email or web address] Please follow instruction sheet, as given.  Follow-Up: Your physician wants you to follow-up in: 6 months. You will receive a reminder letter in the mail two months in advance. If you don't receive a letter, please call our office to schedule the follow-up appointment.   If you need a refill on your cardiac medications before your next appointment, please call your pharmacy.   Thank you for choosing [facility]! Mady Gemma, RN [phone]   Cardiac Nuclear Scan A cardiac nuclear scan is a test that measures blood flow to the heart when a person is resting and when he or she is exercising. The test looks for problems such as:  Not enough blood reaching a portion of the heart.  The heart muscle not working normally.  You may need this test if:  You have heart disease.  You have had abnormal lab results.  You have had heart surgery or angioplasty.  You have chest pain.  You have shortness of breath.  In this test, a radioactive dye (tracer) is injected into your bloodstream. After the tracer has traveled to your heart, an imaging device is used to measure how much of the tracer is absorbed by or distributed to various areas of your heart. This procedure is usually done at a hospital and takes 2-4 hours. Tell a health care provider about:  Any allergies you have.  All medicines you are taking, including vitamins, herbs, eye drops, creams, and over-the-counter medicines.  Any problems you or family members have had with the use of anesthetic medicines.  Any blood disorders you have.  Any  surgeries you have had.  Any medical conditions you have.  Whether you are pregnant or may be pregnant. What are the risks? Generally, this is a safe procedure. However, problems may occur, including:  Serious chest pain and heart attack. This is only a risk if the stress portion of the test is done.  Rapid heartbeat.  Sensation of warmth in your chest. This usually passes quickly.  What happens before the procedure?  Ask your health care provider about changing or stopping your regular medicines. This is especially important if you are taking diabetes medicines or blood thinners.  Remove your jewelry on the day of the procedure. What happens during the procedure?  An IV tube will be inserted into one of your veins.  Your health care provider will inject a small amount of radioactive tracer through the tube.  You will wait for 20-40 minutes while the tracer travels through your bloodstream.  Your heart activity will be monitored with an electrocardiogram (ECG).  You will lie down on an exam table.  Images of your heart will be taken for about 15-20 minutes.  You may be asked to exercise on a treadmill or stationary bike. While you exercise, your heart's activity will be monitored with an ECG, and your blood pressure will be checked. If you are unable to exercise, you may be given a medicine to increase blood flow to parts of your heart.  When blood flow to your heart has peaked, a tracer will again be injected through the IV tube.  After 20-40 minutes,  you will get back on the exam table and have more images taken of your heart.  When the procedure is over, your IV tube will be removed. The procedure may vary among health care providers and hospitals. Depending on the type of tracer used, scans may need to be repeated 3-4 hours later. What happens after the procedure?  Unless your health care provider tells you otherwise, you may return to your normal schedule, including  diet, activities, and medicines.  Unless your health care provider tells you otherwise, you may increase your fluid intake. This will help flush the contrast dye from your body. Drink enough fluid to keep your urine clear or pale yellow.  It is up to you to get your test results. Ask your health care provider, or the department that is doing the test, when your results will be ready. Summary  A cardiac nuclear scan measures the blood flow to the heart when a person is resting and when he or she is exercising.  You may need this test if you are at risk for heart disease.  Tell your health care provider if you are pregnant.  Unless your health care provider tells you otherwise, increase your fluid intake. This will help flush the contrast dye from your body. Drink enough fluid to keep your urine clear or pale yellow. This information is not intended to replace advice given to you by your health care provider. Make sure you discuss any questions you have with your health care provider. Document Released: 02/06/2004 Document Revised: 01/14/2016 Document Reviewed: 12/20/2012 Elsevier Interactive Patient Education  2017 [employer].

## 2017-09-28 NOTE — Telephone Encounter (Signed)
Pt contacted and a message was left on his AM. Pt instructions given and asked to call us back with any questions. S.Mabel Roll EMTP

## 2017-09-29 ENCOUNTER — Ambulatory Visit (HOSPITAL_COMMUNITY): Payer: No Typology Code available for payment source | Attending: Cardiology

## 2017-09-29 ENCOUNTER — Encounter (HOSPITAL_COMMUNITY): Payer: Self-pay | Admitting: *Deleted

## 2017-09-29 VITALS — Ht 67.0 in | Wt 222.0 lb

## 2017-09-29 DIAGNOSIS — R9439 Abnormal result of other cardiovascular function study: Secondary | ICD-10-CM | POA: Diagnosis not present

## 2017-09-29 DIAGNOSIS — I25119 Atherosclerotic heart disease of native coronary artery with unspecified angina pectoris: Secondary | ICD-10-CM

## 2017-09-29 DIAGNOSIS — Z951 Presence of aortocoronary bypass graft: Secondary | ICD-10-CM

## 2017-09-29 DIAGNOSIS — I10 Essential (primary) hypertension: Secondary | ICD-10-CM | POA: Diagnosis not present

## 2017-09-29 DIAGNOSIS — R079 Chest pain, unspecified: Secondary | ICD-10-CM | POA: Insufficient documentation

## 2017-09-29 DIAGNOSIS — I2510 Atherosclerotic heart disease of native coronary artery without angina pectoris: Secondary | ICD-10-CM | POA: Insufficient documentation

## 2017-09-29 DIAGNOSIS — R002 Palpitations: Secondary | ICD-10-CM | POA: Insufficient documentation

## 2017-09-29 LAB — MYOCARDIAL PERFUSION IMAGING
CHL CUP NUCLEAR SRS: 2
CHL CUP NUCLEAR SSS: 5
LVDIAVOL: 101 mL (ref 62–150)
LVSYSVOL: 43 mL
NUC STRESS TID: 1.06
Peak HR: 78 {beats}/min
Rest HR: 54 {beats}/min
SDS: 3

## 2017-09-29 MED ORDER — REGADENOSON 0.4 MG/5ML IV SOLN
0.4000 mg | Freq: Once | INTRAVENOUS | Status: AC
  Administered 2017-09-29: 0.4 mg via INTRAVENOUS

## 2017-09-29 MED ORDER — TECHNETIUM TC 99M TETROFOSMIN IV KIT
32.7000 | PACK | Freq: Once | INTRAVENOUS | Status: AC | PRN
  Administered 2017-09-29: 32.7 via INTRAVENOUS
  Filled 2017-09-29: qty 33

## 2017-09-29 MED ORDER — TECHNETIUM TC 99M TETROFOSMIN IV KIT
9.3000 | PACK | Freq: Once | INTRAVENOUS | Status: AC | PRN
  Administered 2017-09-29: 9.3 via INTRAVENOUS
  Filled 2017-09-29: qty 10

## 2017-09-30 ENCOUNTER — Encounter: Payer: Self-pay | Admitting: *Deleted

## 2017-10-14 ENCOUNTER — Telehealth: Payer: Self-pay | Admitting: Cardiology

## 2017-10-14 ENCOUNTER — Encounter: Payer: Self-pay | Admitting: *Deleted

## 2017-10-14 NOTE — Telephone Encounter (Signed)
OK for a note

## 2017-10-14 NOTE — Telephone Encounter (Signed)
[facility] from Dr Dulce SellarMunley stating that patient is ok to drive from cardiac standpoint.

## 2017-10-14 NOTE — Telephone Encounter (Signed)
Letter has been faxed to Dr. Hoy FinlaySchultz's office per patient's request. No further questions.

## 2018-01-31 ENCOUNTER — Encounter: Payer: Self-pay | Admitting: Cardiology

## 2018-01-31 ENCOUNTER — Ambulatory Visit (INDEPENDENT_AMBULATORY_CARE_PROVIDER_SITE_OTHER): Payer: No Typology Code available for payment source | Admitting: Cardiology

## 2018-01-31 VITALS — BP 122/64 | HR 64 | Ht 69.0 in | Wt 225.0 lb

## 2018-01-31 DIAGNOSIS — I10 Essential (primary) hypertension: Secondary | ICD-10-CM

## 2018-01-31 DIAGNOSIS — E782 Mixed hyperlipidemia: Secondary | ICD-10-CM

## 2018-01-31 DIAGNOSIS — I25119 Atherosclerotic heart disease of native coronary artery with unspecified angina pectoris: Secondary | ICD-10-CM

## 2018-01-31 DIAGNOSIS — R079 Chest pain, unspecified: Secondary | ICD-10-CM

## 2018-01-31 DIAGNOSIS — I493 Ventricular premature depolarization: Secondary | ICD-10-CM

## 2018-01-31 MED ORDER — METOPROLOL TARTRATE 50 MG PO TABS: 50.0000 mg | ORAL_TABLET | Freq: Two times a day (BID) | ORAL | 1 refills | Status: DC

## 2018-01-31 NOTE — Addendum Note (Signed)
Addended by: Arville Care on: 01/31/2018 02:46 PM   Modules accepted: Orders

## 2018-01-31 NOTE — Progress Notes (Signed)
Cardiology Office Note:    Date:  01/31/2018   ID:  Patrick Goodwin, DOB 1960/10/06, MRN [id number]  PCP:  Paulina Fusi, MD  Cardiologist:  Gypsy Balsam, MD    Referring MD: Paulina Fusi, MD   Chief Complaint  Patient presents with  . Chest Pain    medicaiton concerns   And having palpitation chest pain  History of Present Illness:    Patrick Goodwin is a 58 y.o. male with coronary artery disease status post coronary artery bypass graft years ago recurrent stent after that.  He is being also struggling with PVCs that bothers him a lot he used to be taking a lot of metoprolol on a 50 in the morning and 100 in the afternoon however recently has been changed to acebutolol.  He feels much worse with new medication he wants to go back on metoprolol.  Also complained of having some chest pain he had palpitations with my concern is that sometimes palpitations are more per months when he does things.  Denies having any passing out syncope.  Past Medical History:  Diagnosis Date  . Anginal pain (HCC)    noticed some chest discomfort today   . Anxiety    takes Alprazolam daily  . Bruises easily   . Chest pain 04/30/2015  . Chronic back pain   . Coronary artery disease   . Coronary artery disease involving native coronary artery with angina pectoris (HCC) 08/28/2014   Overview:  Conclusions 04/30/15: Diagnostic Procedure Summary Multivessel CAD. No change from prior study. All grafts are widely patent No angiographic culprit PCI target  . Essential hypertension 08/28/2014  . 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 GERD (gastroesophageal reflux disease)    takes Prilosec daily  . History of bronchitis    Jan 2014  . History of colon polyps   . Hx of CABG 04/18/2015  . Hyperlipidemia    takes Zetia and Lipitor daily  . Hypertension    takes Metoprolol daily  . Myocardial infarction (HCC)    x 3   . Peripheral edema    takes Furosemide daily  . PONV (postoperative nausea and vomiting)    states takes alot to get him to sleep  . Seasonal allergies    takes Claritin D prn    Past Surgical History:  Procedure Laterality Date  . ANTERIOR LAT LUMBAR FUSION N/A 03/23/2012   Procedure: ANTERIOR LATERAL LUMBAR FUSION 1 LEVEL;  Surgeon: Venita Lick, MD;  Location: [facility] OR;  Service: Orthopedics;  Laterality: N/A;  XLIF L3-4 WITH POSTERIOR SPINAL FUSION INTERBODY L3-4  . CARDIAC CATHETERIZATION  2001  . COLONOSCOPY    . CORONARY ANGIOPLASTY     3-4 stents placed  . CORONARY ARTERY BYPASS GRAFT  1994   x 4    Current Medications: Current Meds  Medication Sig  . acebutolol (SECTRAL) 200 MG capsule Take 1 capsule (200 mg total) by mouth daily as needed. For bothersome palpitation  . ALPRAZolam (XANAX) 0.5 MG tablet Take 0.5 mg by mouth 3 (three) times daily as needed for anxiety.  Marland Kitchen aspirin EC 81 MG tablet Take 1 tablet (81 mg total) by mouth daily.  . clopidogrel (PLAVIX) 75 MG tablet Take 75 mg by mouth daily.  Marland Kitchen ezetimibe (ZETIA) 10 MG tablet Take 10 mg by mouth daily.  . furosemide (LASIX) 20 MG tablet Take 20 mg by mouth daily.  Marland Kitchen lisinopril (PRINIVIL,ZESTRIL) 10 MG tablet Take 10 mg by mouth daily.  . nitroGLYCERIN (  NITROLINGUAL) 0.4 MG/SPRAY spray Place 1 spray under the tongue every 5 (five) minutes x 3 doses as needed for chest pain.  Marland Kitchen. omeprazole (PRILOSEC) 20 MG capsule Take 20 mg by mouth daily.  . ranolazine (RANEXA) 1000 MG SR tablet Take 1,000 mg by mouth 2 (two) times daily.  . rosuvastatin (CRESTOR) 20 MG tablet Take 20 mg by mouth daily.     Allergies:   Imdur [isosorbide nitrate]   Social History   Socioeconomic History  . Marital status: Married    Spouse name: Not on file  . Number of children: Not on file  . Years of education: Not on file  . Highest education level: Not on file  Occupational History  . Not on file  Social Needs  . Financial resource strain: Not on file  . Food insecurity:    Worry: Not on file    Inability: Not on file  .  Transportation needs:    Medical: Not on file    Non-medical: Not on file  Tobacco Use  . Smoking status: Former Games developermoker  . Smokeless tobacco: Never Used  . Tobacco comment: quit smoking 12+yrs ago  Substance and Sexual Activity  . Alcohol use: No  . Drug use: No  . Sexual activity: Yes  Lifestyle  . Physical activity:    Days per week: Not on file    Minutes per session: Not on file  . Stress: Not on file  Relationships  . Social connections:    Talks on phone: Not on file    Gets together: Not on file    Attends religious service: Not on file    Active member of club or organization: Not on file    Attends meetings of clubs or organizations: Not on file    Relationship status: Not on file  Other Topics Concern  . Not on file  Social History Narrative  . Not on file     Family History: The patient's family history includes CAD in his mother; Heart attack in his father. ROS:   Please see the history of present illness.    All 14 point review of systems negative except as described per history of present illness  EKGs/Labs/Other Studies Reviewed:    Normal sinus rhythm right bundle branch block inferior infarct  Recent Labs: No results found for requested labs within last 8760 hours.  Recent Lipid Panel No results found for: CHOL, TRIG, HDL, CHOLHDL, VLDL, LDLCALC, LDLDIRECT  Physical Exam:    VS:  BP 122/64   Pulse 64   Ht 5\' 9"  (1.753 m)   Wt 225 lb (102.1 kg)   SpO2 98%   BMI 33.23 kg/m     Wt Readings from Last 3 Encounters:  01/31/18 225 lb (102.1 kg)  09/29/17 222 lb (100.7 kg)  09/28/17 222 lb 12.8 oz (101.1 kg)     GEN:  Well nourished, well developed in no acute distress HEENT: Normal NECK: No JVD; No carotid bruits LYMPHATICS: No lymphadenopathy CARDIAC: RRR, no murmurs, no rubs, no gallops RESPIRATORY:  Clear to auscultation without rales, wheezing or rhonchi  ABDOMEN: Soft, non-tender, non-distended MUSCULOSKELETAL:  No edema; No deformity   SKIN: Warm and dry LOWER EXTREMITIES: no swelling NEUROLOGIC:  Alert and oriented x 3 PSYCHIATRIC:  Normal affect   ASSESSMENT:    1. Chest pain, unspecified type   2. Frequent PVCs   3. Essential hypertension   4. Coronary artery disease involving native coronary artery of native heart with  angina pectoris (HCC)   5. Mixed hyperlipidemia    PLAN:    In order of problems listed above:  1. Coronary artery disease worrisome symptoms he does not like acebutolol.  I will put him a smaller dose of metoprolol will start with 50 twice daily with understanding we probably will go higher than that.  He may require antiarrhythmic however I want him to wear monitor for 7 days to see exactly what kind of arrhythmia he is experiencing.  I will also ask him to have echocardiogram to assess left ventricular ejection fraction.  Recently he had a stress test done which was negative for exercise-induced myocardial ischemia however if this frequent ectopy will continue or if you have more worrisome arrhythmia cardiac catheterization to be considered. 2. Hyperlipidemia he is on statin which I will continue. 3. Status post coronary artery bypass graft noted   Medication Adjustments/Labs and Tests Ordered: Current medicines are reviewed at length with the patient today.  Concerns regarding medicines are outlined above.  No orders of the defined types were placed in this encounter.  Medication changes: No orders of the defined types were placed in this encounter.   Signed, Georgeanna Leaobert J. Krasowski, MD, [facility] CenterFACC 01/31/2018 2:26 PM    [facility]

## 2018-01-31 NOTE — Patient Instructions (Signed)
Medication Instructions:  Your physician has recommended you make the following change in your medication:  STOP you Acebutolol START Metoprolol 50 mg 1 tablet 2 times daily If you need a refill on your cardiac medications before your next appointment, please call your pharmacy.   Lab work: None ordered  If you have labs (blood work) drawn today and your tests are completely normal, you will receive your results only by: Marland Kitchen MyChart Message (if you have MyChart) OR . A paper copy in the mail If you have any lab test that is abnormal or we need to change your treatment, we will call you to review the results.  Testing/Procedures: Your physician has requested that you have an echocardiogram. Echocardiography is a painless test that uses sound waves to create images of your heart. It provides your doctor with information about the size and shape of your heart and how well your heart's chambers and valves are working. This procedure takes approximately one hour. There are no restrictions for this procedure.  Your physician has recommended that you wear a holter monitor. Holter monitors are medical devices that record the heart's electrical activity. Doctors most often use these monitors to diagnose arrhythmias. Arrhythmias are problems with the speed or rhythm of the heartbeat. The monitor is a small, portable device. You can wear one while you do your normal daily activities. This is usually used to diagnose what is causing palpitations/syncope (passing out). You will wear this 7 days.  Follow-Up: At [facility], you and your health needs are our priority.  As part of our continuing mission to provide you with exceptional heart care, we have created designated Provider Care Teams.  These Care Teams include your primary Cardiologist (physician) and Advanced Practice Providers (APPs -  Physician Assistants and Nurse Practitioners) who all work together to provide you with the care you need, when you  need it. You will need a follow up appointment in 1 months.   You may see Gypsy Balsam or another member of our [employer] Provider Team in [place]: Norman Herrlich, MD . Belva Crome, MD  Any Other Special Instructions Will Be Listed Below (If Applicable).

## 2018-02-08 ENCOUNTER — Ambulatory Visit (INDEPENDENT_AMBULATORY_CARE_PROVIDER_SITE_OTHER): Payer: No Typology Code available for payment source

## 2018-02-08 DIAGNOSIS — I493 Ventricular premature depolarization: Secondary | ICD-10-CM

## 2018-02-16 ENCOUNTER — Ambulatory Visit (INDEPENDENT_AMBULATORY_CARE_PROVIDER_SITE_OTHER): Payer: No Typology Code available for payment source

## 2018-02-16 ENCOUNTER — Other Ambulatory Visit: Payer: Self-pay

## 2018-02-16 DIAGNOSIS — I25119 Atherosclerotic heart disease of native coronary artery with unspecified angina pectoris: Secondary | ICD-10-CM

## 2018-02-16 DIAGNOSIS — I493 Ventricular premature depolarization: Secondary | ICD-10-CM

## 2018-02-16 DIAGNOSIS — R079 Chest pain, unspecified: Secondary | ICD-10-CM

## 2018-02-16 NOTE — Progress Notes (Signed)
Complete echocardiogram has been performed.  Jimmy Milianna Ericsson RDCS, RVT 

## 2018-03-03 ENCOUNTER — Ambulatory Visit: Payer: No Typology Code available for payment source | Admitting: Cardiology

## 2018-03-20 ENCOUNTER — Other Ambulatory Visit: Payer: Self-pay | Admitting: Cardiology

## 2018-03-22 ENCOUNTER — Telehealth: Payer: Self-pay | Admitting: Cardiology

## 2018-03-22 NOTE — Telephone Encounter (Signed)
Patient advised that results were normal.

## 2018-03-22 NOTE — Telephone Encounter (Signed)
Patient asking for results of echo, please call.

## 2018-03-29 ENCOUNTER — Encounter: Payer: Self-pay | Admitting: Cardiology

## 2018-03-29 ENCOUNTER — Ambulatory Visit (INDEPENDENT_AMBULATORY_CARE_PROVIDER_SITE_OTHER): Payer: No Typology Code available for payment source | Admitting: Cardiology

## 2018-03-29 VITALS — BP 126/64 | HR 61 | Ht 67.0 in | Wt 215.6 lb

## 2018-03-29 DIAGNOSIS — I10 Essential (primary) hypertension: Secondary | ICD-10-CM

## 2018-03-29 DIAGNOSIS — Z951 Presence of aortocoronary bypass graft: Secondary | ICD-10-CM

## 2018-03-29 DIAGNOSIS — I493 Ventricular premature depolarization: Secondary | ICD-10-CM | POA: Diagnosis not present

## 2018-03-29 DIAGNOSIS — I25119 Atherosclerotic heart disease of native coronary artery with unspecified angina pectoris: Secondary | ICD-10-CM

## 2018-03-29 DIAGNOSIS — E782 Mixed hyperlipidemia: Secondary | ICD-10-CM

## 2018-03-29 NOTE — Patient Instructions (Signed)
Medication Instructions:  Your physician recommends that you continue on your current medications as directed. Please refer to the Current Medication list given to you today.  If you need a refill on your cardiac medications before your next appointment, please call your pharmacy.   Lab work: NONE If you have labs (blood work) drawn today and your tests are completely normal, you will receive your results only by: . MyChart Message (if you have MyChart) OR . A paper copy in the mail If you have any lab test that is abnormal or we need to change your treatment, we will call you to review the results.  Testing/Procedures: NONE Follow-Up: At [facility], you and your health needs are our priority.  As part of our continuing mission to provide you with exceptional heart care, we have created designated Provider Care Teams.  These Care Teams include your primary Cardiologist (physician) and Advanced Practice Providers (APPs -  Physician Assistants and Nurse Practitioners) who all work together to provide you with the care you need, when you need it. You will need a follow up appointment in 5 months.  

## 2018-03-29 NOTE — Progress Notes (Signed)
Cardiology Office Note:    Date:  03/29/2018   ID:  Patrick Goodwin, DOB 1960-07-19, MRN [id number]  PCP:  Paulina Fusi, MD  Cardiologist:  Gypsy Balsam, MD    Referring MD: Paulina Fusi, MD   No chief complaint on file. Doing well  History of Present Illness:    Patrick Goodwin is a 58 y.o. male with coronary artery disease status post coronary bypass graft.  He recently started experiencing more palpitations he was put on acebutolol that he did not like the last time when I see him and switch back to metoprolol that seems to be controlling his arrhythmia better.  Since the time I put him on this medication he is feeling better he said he gets rare episode of palpitations.  He did wear Holter monitor which shows some extrasystole but nothing dramatic no sustained arrhythmias.  As a part of evaluation he had echocardiogram done which showed preserved left ventricular ejection fraction.  He also had a stress test which showed no evidence of ischemia things are looking good and characteristic of palpitations is benign.  Past Medical History:  Diagnosis Date  . Anginal pain (HCC)    noticed some chest discomfort today   . Anxiety    takes Alprazolam daily  . Bruises easily   . Chest pain 04/30/2015  . Chronic back pain   . Coronary artery disease   . Coronary artery disease involving native coronary artery with angina pectoris (HCC) 08/28/2014   Overview:  Conclusions 04/30/15: Diagnostic Procedure Summary Multivessel CAD. No change from prior study. All grafts are widely patent No angiographic culprit PCI target  . Essential hypertension 08/28/2014  . 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 GERD (gastroesophageal reflux disease)    takes Prilosec daily  . History of bronchitis    Jan 2014  . History of colon polyps   . Hx of CABG 04/18/2015  . Hyperlipidemia    takes Zetia and Lipitor daily  . Hypertension    takes Metoprolol daily  . Myocardial infarction (HCC)    x 3   . Peripheral  edema    takes Furosemide daily  . PONV (postoperative nausea and vomiting)    states takes alot to get him to sleep  . Seasonal allergies    takes Claritin D prn    Past Surgical History:  Procedure Laterality Date  . ANTERIOR LAT LUMBAR FUSION N/A 03/23/2012   Procedure: ANTERIOR LATERAL LUMBAR FUSION 1 LEVEL;  Surgeon: Venita Lick, MD;  Location: [facility] OR;  Service: Orthopedics;  Laterality: N/A;  XLIF L3-4 WITH POSTERIOR SPINAL FUSION INTERBODY L3-4  . CARDIAC CATHETERIZATION  2001  . COLONOSCOPY    . CORONARY ANGIOPLASTY     3-4 stents placed  . CORONARY ARTERY BYPASS GRAFT  1994   x 4    Current Medications: Current Meds  Medication Sig  . ALPRAZolam (XANAX) 0.5 MG tablet Take 0.5 mg by mouth 3 (three) times daily as needed for anxiety.  Marland Kitchen aspirin EC 81 MG tablet Take 1 tablet (81 mg total) by mouth daily.  . clopidogrel (PLAVIX) 75 MG tablet Take 75 mg by mouth daily.  Marland Kitchen ezetimibe (ZETIA) 10 MG tablet Take 10 mg by mouth daily.  . furosemide (LASIX) 20 MG tablet Take 20 mg by mouth daily.  Marland Kitchen lisinopril (PRINIVIL,ZESTRIL) 10 MG tablet Take 10 mg by mouth daily.  . metoprolol tartrate (LOPRESSOR) 50 MG tablet TAKE 1 TABLET(50 MG) BY MOUTH TWICE DAILY  .  nitroGLYCERIN (NITROLINGUAL) 0.4 MG/SPRAY spray Place 1 spray under the tongue every 5 (five) minutes x 3 doses as needed for chest pain.  Marland Kitchen omeprazole (PRILOSEC) 20 MG capsule Take 20 mg by mouth daily.  . ranolazine (RANEXA) 1000 MG SR tablet Take 1,000 mg by mouth 2 (two) times daily.  . rosuvastatin (CRESTOR) 20 MG tablet Take 20 mg by mouth daily.  . [DISCONTINUED] ALPRAZolam (XANAX) 0.25 MG tablet Take 0.25 mg by mouth.     Allergies:   Imdur [isosorbide nitrate]   Social History   Socioeconomic History  . Marital status: Married    Spouse name: Not on file  . Number of children: Not on file  . Years of education: Not on file  . Highest education level: Not on file  Occupational History  . Not on file  Social  Needs  . Financial resource strain: Not on file  . Food insecurity:    Worry: Not on file    Inability: Not on file  . Transportation needs:    Medical: Not on file    Non-medical: Not on file  Tobacco Use  . Smoking status: Former Games developer  . Smokeless tobacco: Never Used  . Tobacco comment: quit smoking 12+yrs ago  Substance and Sexual Activity  . Alcohol use: No  . Drug use: No  . Sexual activity: Yes  Lifestyle  . Physical activity:    Days per week: Not on file    Minutes per session: Not on file  . Stress: Not on file  Relationships  . Social connections:    Talks on phone: Not on file    Gets together: Not on file    Attends religious service: Not on file    Active member of club or organization: Not on file    Attends meetings of clubs or organizations: Not on file    Relationship status: Not on file  Other Topics Concern  . Not on file  Social History Narrative  . Not on file     Family History: The patient's family history includes CAD in his mother; Heart attack in his father. ROS:   Please see the history of present illness.    All 14 point review of systems negative except as described per history of present illness  EKGs/Labs/Other Studies Reviewed:      Recent Labs: No results found for requested labs within last 8760 hours.  Recent Lipid Panel No results found for: CHOL, TRIG, HDL, CHOLHDL, VLDL, LDLCALC, LDLDIRECT  Physical Exam:    VS:  BP 126/64 (BP Location: Left Arm, Patient Position: Sitting, Cuff Size: Normal)   Pulse 61   Ht 5\' 7"  (1.702 m)   Wt 215 lb 9.6 oz (97.8 kg)   SpO2 99%   BMI 33.77 kg/m     Wt Readings from Last 3 Encounters:  03/29/18 215 lb 9.6 oz (97.8 kg)  01/31/18 225 lb (102.1 kg)  09/29/17 222 lb (100.7 kg)     GEN:  Well nourished, well developed in no acute distress HEENT: Normal NECK: No JVD; No carotid bruits LYMPHATICS: No lymphadenopathy CARDIAC: RRR, no murmurs, no rubs, no gallops RESPIRATORY:  Clear  to auscultation without rales, wheezing or rhonchi  ABDOMEN: Soft, non-tender, non-distended MUSCULOSKELETAL:  No edema; No deformity  SKIN: Warm and dry LOWER EXTREMITIES: no swelling NEUROLOGIC:  Alert and oriented x 3 PSYCHIATRIC:  Normal affect   ASSESSMENT:    1. Frequent PVCs   2. Coronary artery disease involving native  coronary artery of native heart with angina pectoris (HCC)   3. Essential hypertension   4. Hx of CABG   5. Mixed hyperlipidemia    PLAN:    In order of problems listed above:  1. Coronary disease doing well from that point review asymptomatic we will continue present management. 2. Frequent PVCs.  Suppressed successfully with 50 mg and Toprol twice daily asking to increase the dose to half a tablet extra if he got particularly bad day but overall he feels fine overall all work-up has been negative this is benign phenomenon. 3. Essential hypertension blood pressure appears to be well controlled we will continue present management. 4. Dyslipidemia his cholesterol is fine last test I have is from September of last year with LDL of 50 and HDL 34 we will continue with Crestor and Zetia.   Medication Adjustments/Labs and Tests Ordered: Current medicines are reviewed at length with the patient today.  Concerns regarding medicines are outlined above.  No orders of the defined types were placed in this encounter.  Medication changes: No orders of the defined types were placed in this encounter.   Signed, Georgeanna Leaobert J. Marlen Mollica, MD, [facility] Hospital[facility]ACC 03/29/2018 9:22 AM    [facility]

## 2018-05-18 ENCOUNTER — Other Ambulatory Visit: Payer: Self-pay | Admitting: Cardiology

## 2018-05-18 NOTE — Telephone Encounter (Signed)
Rx sent in as requested. 

## 2018-08-28 ENCOUNTER — Telehealth: Payer: Self-pay | Admitting: Cardiology

## 2018-08-28 DIAGNOSIS — Z951 Presence of aortocoronary bypass graft: Secondary | ICD-10-CM

## 2018-08-28 DIAGNOSIS — I25119 Atherosclerotic heart disease of native coronary artery with unspecified angina pectoris: Secondary | ICD-10-CM

## 2018-08-28 NOTE — Telephone Encounter (Signed)
If we can not accomodate before dateline schedule in [facility]

## 2018-08-28 NOTE — Telephone Encounter (Signed)
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   Please call patient to discuss [phone]

## 2018-08-28 NOTE — Telephone Encounter (Signed)
What test will he need to have to do since we currently are not able to do any stress testing?

## 2018-08-29 NOTE — Telephone Encounter (Signed)
It looks like we will not be able to accomodate at [facility] since they are not doing stress Echos either. 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 Looks like he has been getting Lexiscans yearly.

## 2018-08-31 NOTE — Telephone Encounter (Signed)
Lets get lexiscan

## 2018-09-01 NOTE — Addendum Note (Signed)
Addended by: Aleatha Borer on: 09/01/2018 09:45 AM   Modules accepted: Orders

## 2018-09-01 NOTE — Telephone Encounter (Signed)
Order placed, waiting on Dr. Agustin Cree to sign  And schedule

## 2018-09-11 ENCOUNTER — Ambulatory Visit: Payer: No Typology Code available for payment source | Admitting: Cardiology

## 2018-09-12 ENCOUNTER — Telehealth: Payer: Self-pay | Admitting: *Deleted

## 2018-09-12 NOTE — Telephone Encounter (Signed)
Left message on voicemail per DPR in reference to upcoming appointment scheduled on 09/13/18 with detailed instructions given per Myocardial Perfusion Study Information Sheet for the test. LM to arrive 15 minutes early, and that it is imperative to arrive on time for appointment to keep from having the test rescheduled. If you need to cancel or reschedule your appointment, please call the office within 24 hours of your appointment. Failure to do so may result in a cancellation of your appointment, and a $50 no show fee. Phone number given for call back for any questions. Kirstie Peri

## 2018-09-12 NOTE — Telephone Encounter (Signed)
Contacted patient due to opening for tomorrow in the office. He was able to take the appointment. Went over directions over the phone and he has expressed understanding.

## 2018-09-13 ENCOUNTER — Ambulatory Visit (INDEPENDENT_AMBULATORY_CARE_PROVIDER_SITE_OTHER): Payer: No Typology Code available for payment source

## 2018-09-13 ENCOUNTER — Other Ambulatory Visit: Payer: Self-pay

## 2018-09-13 DIAGNOSIS — Z951 Presence of aortocoronary bypass graft: Secondary | ICD-10-CM

## 2018-09-13 DIAGNOSIS — I25119 Atherosclerotic heart disease of native coronary artery with unspecified angina pectoris: Secondary | ICD-10-CM

## 2018-09-13 LAB — MYOCARDIAL PERFUSION IMAGING
LV dias vol: 101 mL (ref 62–150)
LV sys vol: 42 mL
Peak HR: 93 {beats}/min
Rest HR: 68 {beats}/min
SDS: 5
SRS: 4
SSS: 9
TID: 1.1

## 2018-09-13 MED ORDER — TECHNETIUM TC 99M TETROFOSMIN IV KIT
31.2000 | PACK | Freq: Once | INTRAVENOUS | Status: AC | PRN
  Administered 2018-09-13: 31.2 via INTRAVENOUS

## 2018-09-13 MED ORDER — TECHNETIUM TC 99M TETROFOSMIN IV KIT
10.1000 | PACK | Freq: Once | INTRAVENOUS | Status: AC | PRN
  Administered 2018-09-13: 10.1 via INTRAVENOUS

## 2018-09-13 MED ORDER — REGADENOSON 0.4 MG/5ML IV SOLN
0.4000 mg | Freq: Once | INTRAVENOUS | Status: AC
  Administered 2018-09-13: 0.4 mg via INTRAVENOUS

## 2018-12-05 ENCOUNTER — Other Ambulatory Visit: Payer: Self-pay | Admitting: Cardiology

## 2018-12-08 ENCOUNTER — Ambulatory Visit: Payer: No Typology Code available for payment source | Admitting: Cardiology

## 2018-12-08 NOTE — Telephone Encounter (Signed)
Metoprolol Tartrate refill sent to [facility] on [address]

## 2019-01-02 ENCOUNTER — Other Ambulatory Visit: Payer: Self-pay | Admitting: Cardiology

## 2019-05-01 ENCOUNTER — Encounter: Payer: Self-pay | Admitting: Cardiology

## 2019-05-01 ENCOUNTER — Other Ambulatory Visit: Payer: Self-pay

## 2019-05-01 ENCOUNTER — Ambulatory Visit (INDEPENDENT_AMBULATORY_CARE_PROVIDER_SITE_OTHER): Payer: No Typology Code available for payment source | Admitting: Cardiology

## 2019-05-01 ENCOUNTER — Observation Stay (HOSPITAL_COMMUNITY)
Admission: EM | Admit: 2019-05-01 | Discharge: 2019-05-02 | Disposition: A | Discharge status: Home / Self Care | Payer: No Typology Code available for payment source | Attending: Emergency Medicine | Admitting: Emergency Medicine

## 2019-05-01 ENCOUNTER — Emergency Department (HOSPITAL_COMMUNITY): Payer: No Typology Code available for payment source

## 2019-05-01 ENCOUNTER — Telehealth: Payer: Self-pay | Admitting: Cardiology

## 2019-05-01 ENCOUNTER — Encounter (HOSPITAL_COMMUNITY): Payer: Self-pay | Admitting: Emergency Medicine

## 2019-05-01 VITALS — BP 134/78 | HR 60 | Ht 67.0 in | Wt 224.4 lb

## 2019-05-01 DIAGNOSIS — I358 Other nonrheumatic aortic valve disorders: Secondary | ICD-10-CM | POA: Diagnosis not present

## 2019-05-01 DIAGNOSIS — Z87891 Personal history of nicotine dependence: Secondary | ICD-10-CM | POA: Diagnosis not present

## 2019-05-01 DIAGNOSIS — R600 Localized edema: Secondary | ICD-10-CM | POA: Insufficient documentation

## 2019-05-01 DIAGNOSIS — T82858A Stenosis of vascular prosthetic devices, implants and grafts, initial encounter: Secondary | ICD-10-CM | POA: Diagnosis not present

## 2019-05-01 DIAGNOSIS — I25119 Atherosclerotic heart disease of native coronary artery with unspecified angina pectoris: Secondary | ICD-10-CM | POA: Diagnosis not present

## 2019-05-01 DIAGNOSIS — I25118 Atherosclerotic heart disease of native coronary artery with other forms of angina pectoris: Secondary | ICD-10-CM | POA: Diagnosis not present

## 2019-05-01 DIAGNOSIS — E785 Hyperlipidemia, unspecified: Secondary | ICD-10-CM | POA: Diagnosis present

## 2019-05-01 DIAGNOSIS — F419 Anxiety disorder, unspecified: Secondary | ICD-10-CM | POA: Diagnosis not present

## 2019-05-01 DIAGNOSIS — Z20822 Contact with and (suspected) exposure to covid-19: Secondary | ICD-10-CM | POA: Insufficient documentation

## 2019-05-01 DIAGNOSIS — R079 Chest pain, unspecified: Secondary | ICD-10-CM | POA: Diagnosis present

## 2019-05-01 DIAGNOSIS — Z79899 Other long term (current) drug therapy: Secondary | ICD-10-CM | POA: Diagnosis not present

## 2019-05-01 DIAGNOSIS — E782 Mixed hyperlipidemia: Secondary | ICD-10-CM

## 2019-05-01 DIAGNOSIS — Z951 Presence of aortocoronary bypass graft: Secondary | ICD-10-CM

## 2019-05-01 DIAGNOSIS — Z7902 Long term (current) use of antithrombotics/antiplatelets: Secondary | ICD-10-CM | POA: Diagnosis not present

## 2019-05-01 DIAGNOSIS — K219 Gastro-esophageal reflux disease without esophagitis: Secondary | ICD-10-CM | POA: Insufficient documentation

## 2019-05-01 DIAGNOSIS — I10 Essential (primary) hypertension: Secondary | ICD-10-CM

## 2019-05-01 DIAGNOSIS — Z7982 Long term (current) use of aspirin: Secondary | ICD-10-CM | POA: Diagnosis not present

## 2019-05-01 DIAGNOSIS — I493 Ventricular premature depolarization: Secondary | ICD-10-CM

## 2019-05-01 DIAGNOSIS — Z888 Allergy status to other drugs, medicaments and biological substances status: Secondary | ICD-10-CM | POA: Insufficient documentation

## 2019-05-01 DIAGNOSIS — Z8249 Family history of ischemic heart disease and other diseases of the circulatory system: Secondary | ICD-10-CM | POA: Diagnosis not present

## 2019-05-01 DIAGNOSIS — I208 Other forms of angina pectoris: Secondary | ICD-10-CM

## 2019-05-01 DIAGNOSIS — I252 Old myocardial infarction: Secondary | ICD-10-CM | POA: Diagnosis not present

## 2019-05-01 DIAGNOSIS — I2089 Other forms of angina pectoris: Secondary | ICD-10-CM

## 2019-05-01 DIAGNOSIS — I4510 Unspecified right bundle-branch block: Secondary | ICD-10-CM | POA: Insufficient documentation

## 2019-05-01 DIAGNOSIS — R0789 Other chest pain: Secondary | ICD-10-CM | POA: Diagnosis present

## 2019-05-01 DIAGNOSIS — Z955 Presence of coronary angioplasty implant and graft: Secondary | ICD-10-CM | POA: Diagnosis not present

## 2019-05-01 DIAGNOSIS — R0609 Other forms of dyspnea: Secondary | ICD-10-CM

## 2019-05-01 LAB — CBC
HCT: 42.4 % (ref 39.0–52.0)
Hemoglobin: 13.8 g/dL (ref 13.0–17.0)
MCH: 29.1 pg (ref 26.0–34.0)
MCHC: 32.5 g/dL (ref 30.0–36.0)
MCV: 89.5 fL (ref 80.0–100.0)
Platelets: 241 10*3/uL (ref 150–400)
RBC: 4.74 MIL/uL (ref 4.22–5.81)
RDW: 11.9 % (ref 11.5–15.5)
WBC: 6.8 10*3/uL (ref 4.0–10.5)
nRBC: 0 % (ref 0.0–0.2)

## 2019-05-01 LAB — COMPREHENSIVE METABOLIC PANEL
ALT: 23 U/L (ref 0–44)
AST: 25 U/L (ref 15–41)
Albumin: 3.3 g/dL — ABNORMAL LOW (ref 3.5–5.0)
Alkaline Phosphatase: 65 U/L (ref 38–126)
Anion gap: 10 (ref 5–15)
BUN: 8 mg/dL (ref 6–20)
CO2: 25 mmol/L (ref 22–32)
Calcium: 8.7 mg/dL — ABNORMAL LOW (ref 8.9–10.3)
Chloride: 104 mmol/L (ref 98–111)
Creatinine, Ser: 1.09 mg/dL (ref 0.61–1.24)
GFR calc Af Amer: >60 mL/min (ref >60)
GFR calc non Af Amer: >60 mL/min (ref >60)
Glucose, Bld: 96 mg/dL (ref 70–99)
Potassium: 3.9 mmol/L (ref 3.5–5.1)
Sodium: 139 mmol/L (ref 135–145)
Total Bilirubin: 1 mg/dL (ref 0.3–1.2)
Total Protein: 6.3 g/dL — ABNORMAL LOW (ref 6.5–8.1)

## 2019-05-01 LAB — CBC WITH DIFFERENTIAL/PLATELET
Abs Immature Granulocytes: 0.02 10*3/uL (ref 0.00–0.07)
Basophils Absolute: 0.1 10*3/uL (ref 0.0–0.1)
Basophils Relative: 1 %
Eosinophils Absolute: 0.1 10*3/uL (ref 0.0–0.5)
Eosinophils Relative: 1 %
HCT: 42.8 % (ref 39.0–52.0)
Hemoglobin: 14 g/dL (ref 13.0–17.0)
Immature Granulocytes: 0 %
Lymphocytes Relative: 20 %
Lymphs Abs: 1.3 10*3/uL (ref 0.7–4.0)
MCH: 29.4 pg (ref 26.0–34.0)
MCHC: 32.7 g/dL (ref 30.0–36.0)
MCV: 89.7 fL (ref 80.0–100.0)
Monocytes Absolute: 0.7 10*3/uL (ref 0.1–1.0)
Monocytes Relative: 11 %
Neutro Abs: 4.3 10*3/uL (ref 1.7–7.7)
Neutrophils Relative %: 67 %
Platelets: 253 10*3/uL (ref 150–400)
RBC: 4.77 MIL/uL (ref 4.22–5.81)
RDW: 11.9 % (ref 11.5–15.5)
WBC: 6.5 10*3/uL (ref 4.0–10.5)
nRBC: 0 % (ref 0.0–0.2)

## 2019-05-01 LAB — BRAIN NATRIURETIC PEPTIDE: B Natriuretic Peptide: 83.3 pg/mL (ref 0.0–100.0)

## 2019-05-01 LAB — SARS CORONAVIRUS 2 (TAT 6-24 HRS): SARS Coronavirus 2: NEGATIVE

## 2019-05-01 LAB — TROPONIN I (HIGH SENSITIVITY)
Troponin I (High Sensitivity): 6 ng/L (ref <18)
Troponin I (High Sensitivity): 7 ng/L (ref <18)

## 2019-05-01 LAB — HIV ANTIBODY (ROUTINE TESTING W REFLEX): HIV Screen 4th Generation wRfx: NONREACTIVE

## 2019-05-01 MED ORDER — MELATONIN 3 MG PO TABS
9.0000 mg | ORAL_TABLET | Freq: Every day | ORAL | Status: DC
  Administered 2019-05-01: 21:00:00 9 mg via ORAL
  Filled 2019-05-01: qty 3

## 2019-05-01 MED ORDER — HEPARIN SODIUM (PORCINE) 5000 UNIT/ML IJ SOLN
5000.0000 [IU] | Freq: Three times a day (TID) | INTRAMUSCULAR | Status: DC
  Administered 2019-05-01 (×2): 5000 [IU] via SUBCUTANEOUS
  Filled 2019-05-01 (×2): qty 1

## 2019-05-01 MED ORDER — ASPIRIN 300 MG RE SUPP: 300.0000 mg | RECTAL | Status: DC

## 2019-05-01 MED ORDER — ALPRAZOLAM 0.5 MG PO TABS
0.5000 mg | ORAL_TABLET | Freq: Three times a day (TID) | ORAL | Status: DC | PRN
  Administered 2019-05-01: 21:00:00 0.5 mg via ORAL
  Filled 2019-05-01: qty 1

## 2019-05-01 MED ORDER — SODIUM CHLORIDE 0.9 % WEIGHT BASED INFUSION
3.0000 mL/kg/h | INTRAVENOUS | Status: DC
  Administered 2019-05-02: 3 mL/kg/h via INTRAVENOUS

## 2019-05-01 MED ORDER — ONDANSETRON HCL 4 MG/2ML IJ SOLN: 4.0000 mg | Freq: Four times a day (QID) | INTRAMUSCULAR | Status: DC | PRN

## 2019-05-01 MED ORDER — SODIUM CHLORIDE 0.9 % IV SOLN: 250.0000 mL | INTRAVENOUS | Status: DC | PRN

## 2019-05-01 MED ORDER — NITROGLYCERIN 0.4 MG SL SUBL: 0.4000 mg | SUBLINGUAL_TABLET | SUBLINGUAL | Status: DC | PRN

## 2019-05-01 MED ORDER — CLOPIDOGREL BISULFATE 75 MG PO TABS
75.0000 mg | ORAL_TABLET | Freq: Every day | ORAL | Status: DC
  Administered 2019-05-01 – 2019-05-02 (×2): 75 mg via ORAL
  Filled 2019-05-01 (×3): qty 1

## 2019-05-01 MED ORDER — SODIUM CHLORIDE 0.9% FLUSH: 3.0000 mL | INTRAVENOUS | Status: DC | PRN

## 2019-05-01 MED ORDER — ASPIRIN 81 MG PO CHEW
81.0000 mg | CHEWABLE_TABLET | Freq: Once | ORAL | Status: AC
  Administered 2019-05-02: 04:00:00 81 mg via ORAL
  Filled 2019-05-01: qty 1

## 2019-05-01 MED ORDER — EZETIMIBE 10 MG PO TABS
10.0000 mg | ORAL_TABLET | Freq: Every day | ORAL | Status: DC
  Administered 2019-05-02: 10:00:00 10 mg via ORAL
  Filled 2019-05-01: qty 1

## 2019-05-01 MED ORDER — SODIUM CHLORIDE 0.9% FLUSH
3.0000 mL | Freq: Two times a day (BID) | INTRAVENOUS | Status: DC
  Administered 2019-05-01 – 2019-05-02 (×2): 3 mL via INTRAVENOUS

## 2019-05-01 MED ORDER — METOPROLOL TARTRATE 50 MG PO TABS
50.0000 mg | ORAL_TABLET | Freq: Two times a day (BID) | ORAL | Status: DC
  Administered 2019-05-01 – 2019-05-02 (×2): 50 mg via ORAL
  Filled 2019-05-01 (×2): qty 1

## 2019-05-01 MED ORDER — ASPIRIN 81 MG PO CHEW: 324.0000 mg | CHEWABLE_TABLET | ORAL | Status: DC

## 2019-05-01 MED ORDER — RANOLAZINE ER 500 MG PO TB12
1000.0000 mg | ORAL_TABLET | Freq: Two times a day (BID) | ORAL | Status: DC
  Administered 2019-05-01 – 2019-05-02 (×2): 1000 mg via ORAL
  Filled 2019-05-01 (×3): qty 2

## 2019-05-01 MED ORDER — ACETAMINOPHEN 325 MG PO TABS: 650.0000 mg | ORAL_TABLET | ORAL | Status: DC | PRN

## 2019-05-01 MED ORDER — ASPIRIN EC 81 MG PO TBEC: 81.0000 mg | DELAYED_RELEASE_TABLET | Freq: Every day | ORAL | Status: DC

## 2019-05-01 MED ORDER — SODIUM CHLORIDE 0.9 % WEIGHT BASED INFUSION
1.0000 mL/kg/h | INTRAVENOUS | Status: DC
  Administered 2019-05-02: 06:00:00 1 mL/kg/h via INTRAVENOUS

## 2019-05-01 NOTE — Patient Instructions (Signed)
Patient transported to [facility] via ambulance for chest pain.

## 2019-05-01 NOTE — ED Triage Notes (Signed)
Pt came from cardiology office that he visited due to chest pain over the last 2-3 weeks. Hx of MI.  Ems gave 324 mg ASA.  20G left hand started by ems.

## 2019-05-01 NOTE — Telephone Encounter (Signed)
Called patient back about his symptoms. Patient complaining of SOB, dizziness, palpitations, and chest pain the goes across the chest off and on for a couple of weeks. Patient stated he will take a nitro and it helps sometimes. 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 Patient stated in the last week he has had swelling in his hands and feet. Discuss with Dr. Bing Matter, DOD, he agreed to see him today. Placed patient on schedule today. Patient will come and get evaluated today.

## 2019-05-01 NOTE — Plan of Care (Signed)
Pt educated on up coming heart cath scheduled for AM. Verbalizes understanding. No other needs.

## 2019-05-01 NOTE — ED Provider Notes (Signed)
[facility] EMERGENCY DEPARTMENT Provider Note   CSN: [id number] Arrival date & time: 05/01/19  1224     History Chief Complaint  Patient presents with  . Chest Pain    Patrick Goodwin is a 59 y.o. male with a past medical history of CAD status post CABG, hypertension, hyperlipidemia, GERD presenting to the ED with a chief complaint of chest tightness and dyspnea on exertion for the past 2 to 3 weeks.  Reports symptoms are worse when he is working at his job or outside.  Reports intermittent dizziness associated with this as well.  Symptoms improve when he rests, sits down without any activity.  He reports some "upset stomach" feeling as well as nausea but denies any changes to bowel movements, vomiting, fever.  Denies any sick contacts with similar symptoms.  Had noticed intermittent upper and lower extremity edema that is worse when he wakes up and improves throughout the day. He has been taking his home medications as prescribed. He was seen and evaluated by cardiologist this morning and sent to the ED for further evaluation.  HPI     Past Medical History:  Diagnosis Date  . Anginal pain (HCC)    noticed some chest discomfort today   . Anxiety    takes Alprazolam daily  . Bruises easily   . Chest pain 04/30/2015  . Chronic back pain   . Coronary artery disease   . Coronary artery disease involving native coronary artery with angina pectoris (HCC) 08/28/2014   Overview:  Conclusions 04/30/15: Diagnostic Procedure Summary Multivessel CAD. No change from prior study. All grafts are widely patent No angiographic culprit PCI target  . Essential hypertension 08/28/2014  . 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 GERD (gastroesophageal reflux disease)    takes Prilosec daily  . History of bronchitis    Jan 2014  . History of colon polyps   . Hx of CABG 04/18/2015  . Hyperlipidemia    takes Zetia and Lipitor daily  . Hypertension    takes Metoprolol daily  . Myocardial infarction  (HCC)    x 3   . Peripheral edema    takes Furosemide daily  . PONV (postoperative nausea and vomiting)    states takes alot to get him to sleep  . Seasonal allergies    takes Claritin D prn    Patient Active Problem List   Diagnosis Date Noted  . Chest pain 04/30/2015  . Hx of CABG 1994 04/18/2015  . Coronary artery disease involving native coronary artery with angina pectoris (HCC) 08/28/2014  . Essential hypertension 08/28/2014  . 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 Hyperlipidemia 08/28/2014    Past Surgical History:  Procedure Laterality Date  . ANTERIOR LAT LUMBAR FUSION N/A 03/23/2012   Procedure: ANTERIOR LATERAL LUMBAR FUSION 1 LEVEL;  Surgeon: Venita Lick, MD;  Location: [facility] OR;  Service: Orthopedics;  Laterality: N/A;  XLIF L3-4 WITH POSTERIOR SPINAL FUSION INTERBODY L3-4  . CARDIAC CATHETERIZATION  2001  . COLONOSCOPY    . CORONARY ANGIOPLASTY     3-4 stents placed  . CORONARY ARTERY BYPASS GRAFT  1994   x 4       Family History  Problem Relation Age of Onset  . CAD Mother   . Heart attack Father     Social History   Tobacco Use  . Smoking status: Former Games developer  . Smokeless tobacco: Never Used  . Tobacco comment: quit smoking 12+yrs ago  Substance Use Topics  . Alcohol  use: No  . Drug use: No    Home Medications Prior to Admission medications   Medication Sig Start Date End Date Taking? Authorizing Provider  ALPRAZolam Prudy Feeler) 0.5 MG tablet Take 0.5 mg by mouth 2 (two) times daily as needed for anxiety.    Yes [provider]  aspirin EC 81 MG tablet Take 1 tablet (81 mg total) by mouth daily. 09/28/17  Yes Baldo Daub, MD  clopidogrel (PLAVIX) 75 MG tablet Take 75 mg by mouth daily.   Yes [provider]  ezetimibe (ZETIA) 10 MG tablet Take 10 mg by mouth daily.   Yes [provider]  furosemide (LASIX) 20 MG tablet Take 20 mg by mouth daily.   Yes [provider]  lisinopril (PRINIVIL,ZESTRIL) 10 MG tablet Take 10  mg by mouth daily.   Yes [provider]  metoprolol tartrate (LOPRESSOR) 50 MG tablet TAKE 1 TABLET(50 MG) BY MOUTH TWICE DAILY Patient taking differently: Take 50 mg by mouth 2 (two) times daily.  01/02/19  Yes Georgeanna Lea, MD  nitroGLYCERIN (NITROLINGUAL) 0.4 MG/SPRAY spray Place 1 spray under the tongue every 5 (five) minutes x 3 doses as needed for chest pain.   Yes [provider]  omeprazole (PRILOSEC) 20 MG capsule Take 20 mg by mouth daily.   Yes [provider]  ranolazine (RANEXA) 1000 MG SR tablet Take 1,000 mg by mouth 2 (two) times daily. 03/10/15  Yes [provider]  acebutolol (SECTRAL) 200 MG capsule Take 200 mg by mouth as needed. 02/24/18   [provider]    Allergies    Imdur [isosorbide nitrate]  Review of Systems   Review of Systems  Constitutional: Negative for appetite change, chills and fever.  HENT: Negative for ear pain, rhinorrhea, sneezing and sore throat.   Eyes: Negative for photophobia and visual disturbance.  Respiratory: Positive for chest tightness and shortness of breath. Negative for cough and wheezing.   Cardiovascular: Positive for chest pain. Negative for palpitations.  Gastrointestinal: Negative for abdominal pain, blood in stool, constipation, diarrhea, nausea and vomiting.  Genitourinary: Negative for dysuria, hematuria and urgency.  Musculoskeletal: Negative for myalgias.  Skin: Negative for rash.  Neurological: Positive for dizziness. Negative for weakness and light-headedness.    Physical Exam Updated Vital Signs BP (!) 153/83 (BP Location: Right Arm)   Pulse 60   Temp 98.1 F (36.7 C) (Oral)   Resp 20   SpO2 99%   Physical Exam Vitals and nursing note reviewed.  Constitutional:      General: He is not in acute distress.    Appearance: He is well-developed.  HENT:     Head: Normocephalic and atraumatic.     Nose: Nose normal.  Eyes:     General: No scleral icterus.       Left  eye: No discharge.     Conjunctiva/sclera: Conjunctivae normal.  Cardiovascular:     Rate and Rhythm: Normal rate and regular rhythm.     Heart sounds: Normal heart sounds. No murmur. No friction rub. No gallop.   Pulmonary:     Effort: Pulmonary effort is normal. No respiratory distress.     Breath sounds: Normal breath sounds.  Abdominal:     General: Bowel sounds are normal. There is no distension.     Palpations: Abdomen is soft.     Tenderness: There is no abdominal tenderness. There is no guarding.  Musculoskeletal:        General: Normal range  of motion.     Cervical back: Normal range of motion and neck supple.     Comments: No calf tenderness, lower extremity edema or erythema noted.  Skin:    General: Skin is warm and dry.     Findings: No rash.  Neurological:     Mental Status: He is alert.     Motor: No abnormal muscle tone.     Coordination: Coordination normal.     ED Results / Procedures / Treatments   Labs (all labs ordered are listed, but only abnormal results are displayed) Labs Reviewed  COMPREHENSIVE METABOLIC PANEL - Abnormal; Notable for the following components:      Result Value   Calcium 8.7 (*)    Total Protein 6.3 (*)    Albumin 3.3 (*)    All other components within normal limits  SARS CORONAVIRUS 2 (TAT 6-24 HRS)  CBC WITH DIFFERENTIAL/PLATELET  BRAIN NATRIURETIC PEPTIDE  TROPONIN I (HIGH SENSITIVITY)  TROPONIN I (HIGH SENSITIVITY)    EKG EKG Interpretation  Date/Time:  Tuesday May 01 2019 12:55:08 EDT Ventricular Rate:  53 PR Interval:  198 QRS Duration: 132 QT Interval:  488 QTC Calculation: 457 R Axis:   9 Text Interpretation: Sinus bradycardia Right bundle branch block Inferior infarct , age undetermined Anterior infarct , age undetermined Abnormal ECG no significant change since Aug 2020 Confirmed by Pricilla Loveless [phone]) on 05/01/2019 1:17:25 PM   Radiology No results found.  Procedures Procedures (including critical  care time)  Medications Ordered in ED Medications - No data to display  ED Course  I have reviewed the triage vital signs and the nursing notes.  Pertinent labs & imaging results that were available during my care of the patient were reviewed by me and considered in my medical decision making (see chart for details).  Clinical Course as of May 01 1611  Tue May 01, 2019  1408 No acute abnormalities.  DG Chest 2 View [HK]  1408 Troponin I (High Sensitivity): 6 [HK]    Clinical Course User Index [HK] Dietrich Pates, PA-C   MDM Rules/Calculators/A&P                      [phone] M with a past medical history of CAD with remote history of CABG, several stents placed in the past, HTN, HLD presents to ED with 2 to 3-week history of chest tightness, dyspnea on exertion.  Reports associated nausea as well.  Reports intermittent upper and lower extremity edema that is worse with waking up and improves throughout the day.  Has not tried medications to help with his symptoms.  He is unsure if this feels similar to the times that he had a heart attack.  He was seen and evaluated by cardiology this morning, sent to the ER for admission to rule out ACS or other causes of symptoms and for possible cardiac catheterization in the near future.  Patient not having any symptoms during my evaluation.  EKG shows sinus bradycardia, no changes from prior tracings.  CBC, BMP, CMP unremarkable.  Initial troponin is negative.  Chest x-ray without any acute findings.  Patient is hemodynamically stable here.  I have consulted cardiology who will evaluate the patient for possible admission.  Final Clinical Impression(s) / ED Diagnoses Final diagnoses:  Nonspecific chest pain  Dyspnea on exertion    Rx / DC Orders ED Discharge Orders    None      Portions of this note were generated  with Lobbyist. Dictation errors may occur despite best attempts at proofreading.    Delia Heady, PA-C 05/01/19  1613    Sherwood Gambler, MD 05/07/19 [phone]

## 2019-05-01 NOTE — Telephone Encounter (Signed)
Pt c/o of Chest Pain: STAT if CP now or developed within 24 hours  1. Are you having CP right now? No   2. Are you experiencing any other symptoms (ex. SOB, nausea, vomiting, sweating)? SOB, dizzy, palpitations, and chest discomfort.  He states has some swelling in his fee, legs and hands.  3. How long have you been experiencing CP? Two weeks   4. Is your CP continuous or coming and going? Comes and goes  5. Have you taken Nitroglycerin? yes  ?

## 2019-05-01 NOTE — H&P (Signed)
Cardiology Office Note:    Date:  05/01/2019   ID:  Patrick Goodwin, DOB [birth year], MRN [id number]  PCP:  Nicoletta Dress, MD  Cardiologist:  Reino Bellis, NP    Referring MD: No ref. provider found   Chief Complaint  Patient presents with  . Chest Pain  I have shortness of breath and chest pain  History of Present Illness:    Patrick Goodwin is a 59 y.o. male positive history significant for coronary artery disease, status post coronary artery bypass graft also history of essential hypertension, anxiety, dyslipidemia symptomatic PVCs.  He called our office earlier today and requested to be seen.  He was apparently stable at the time but he has been having tightness in the chest which is exertional with minimal exertion hospital shortness of breath and palpitations for about 2 weeks.  This is to the point that he cannot work.  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  He is afraid to go to the backyard because of the sensation.  At the moment of my interview he is asymptomatic, denies having any chest pain, tightness, pressure, burning in the chest at the time of my interview.  Past Medical History:  Diagnosis Date  . Anginal pain ([place])    noticed some chest discomfort today   . Anxiety    takes Alprazolam daily  . Bruises easily   . Chest pain 04/30/2015  . Chronic back pain   . Coronary artery disease   . Coronary artery disease involving native coronary artery with angina pectoris ([place]) 08/28/2014   Overview:  Conclusions 04/30/15: Diagnostic Procedure Summary Multivessel CAD. No change from prior study. All grafts are widely patent No angiographic culprit PCI target  . Essential hypertension 08/28/2014  . 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 GERD (gastroesophageal reflux disease)    takes Prilosec daily  . History of bronchitis    Jan 2014  . History of colon polyps   . Hx of CABG  04/18/2015  . Hyperlipidemia    takes Zetia and Lipitor daily  . Hypertension    takes Metoprolol daily  . Myocardial infarction ([place])    x 3   . Peripheral edema    takes Furosemide daily  . PONV (postoperative nausea and vomiting)    states takes alot to get him to sleep  . Seasonal allergies    takes Claritin D prn    Past Surgical History:  Procedure Laterality Date  . ANTERIOR LAT LUMBAR FUSION N/A 03/23/2012   Procedure: ANTERIOR LATERAL LUMBAR FUSION 1 LEVEL;  Surgeon: Melina Schools, MD;  Location: [facility];  Service: Orthopedics;  Laterality: N/A;  XLIF L3-4 WITH POSTERIOR SPINAL FUSION INTERBODY L3-4  . CARDIAC CATHETERIZATION  2001  . COLONOSCOPY    . CORONARY ANGIOPLASTY     3-4 stents placed  . CORONARY ARTERY BYPASS GRAFT  1994   x 4    Current Medications: Current Meds  Medication Sig  . ALPRAZolam (XANAX) 0.5 MG tablet Take 0.5 mg by mouth 2 (two) times daily as needed for anxiety.   Marland Kitchen aspirin EC 81 MG tablet Take 1 tablet (81 mg total) by mouth daily.  . clopidogrel (PLAVIX) 75 MG tablet Take 75 mg by mouth daily.  Marland Kitchen ezetimibe (ZETIA) 10 MG tablet Take 10 mg by mouth daily.  . furosemide (LASIX) 20 MG tablet Take 20  mg by mouth daily.  Marland Kitchen lisinopril (PRINIVIL,ZESTRIL) 10 MG tablet Take 10 mg by mouth daily.  . metoprolol tartrate (LOPRESSOR) 50 MG tablet TAKE 1 TABLET(50 MG) BY MOUTH TWICE DAILY (Patient taking differently: Take 50 mg by mouth 2 (two) times daily. )  . nitroGLYCERIN (NITROLINGUAL) 0.4 MG/SPRAY spray Place 1 spray under the tongue every 5 (five) minutes x 3 doses as needed for chest pain.  Marland Kitchen omeprazole (PRILOSEC) 20 MG capsule Take 20 mg by mouth daily.  . ranolazine (RANEXA) 1000 MG SR tablet Take 1,000 mg by mouth 2 (two) times daily.     Allergies:   Imdur [isosorbide nitrate]   Social History   Socioeconomic History  . Marital status: Married    Spouse name: Not on file  . Number of children: Not on file  . Years of education: Not on  file  . Highest education level: Not on file  Occupational History  . Not on file  Tobacco Use  . Smoking status: Former Games developer  . Smokeless tobacco: Never Used  . Tobacco comment: quit smoking 12+yrs ago  Substance and Sexual Activity  . Alcohol use: No  . Drug use: No  . Sexual activity: Yes  Other Topics Concern  . Not on file  Social History Narrative  . Not on file   Social Determinants of Health   Financial Resource Strain:   . Difficulty of Paying Living Expenses:   Food Insecurity:   . Worried About Programme researcher, broadcasting/film/video in the Last Year:   . Barista in the Last Year:   Transportation Needs:   . Freight forwarder (Medical):   Marland Kitchen Lack of Transportation (Non-Medical):   Physical Activity:   . Days of Exercise per Week:   . Minutes of Exercise per Session:   Stress:   . Feeling of Stress :   Social Connections:   . Frequency of Communication with Friends and Family:   . Frequency of Social Gatherings with Friends and Family:   . Attends Religious Services:   . Active Member of Clubs or Organizations:   . Attends Banker Meetings:   Marland Kitchen Marital Status:      Family History: The patient's family history includes CAD in his mother; Heart attack in his father. ROS:   Please see the history of present illness.    All 14 point review of systems negative except as described per history of present illness  EKGs/Labs/Other Studies Reviewed:      Recent Labs: 05/01/2019: ALT 23; B Natriuretic Peptide 83.3; BUN 8; Creatinine, Ser 1.09; Hemoglobin 14.0; Platelets 253; Potassium 3.9; Sodium 139  Recent Lipid Panel No results found for: CHOL, TRIG, HDL, CHOLHDL, VLDL, LDLCALC, LDLDIRECT  Physical Exam:    VS:  BP (!) 153/83 (BP Location: Right Arm)   Pulse 60   Temp 98.1 F (36.7 C) (Oral)   Resp 20   SpO2 99%     Wt Readings from Last 3 Encounters:  05/01/19 101.8 kg  09/13/18 97.5 kg  03/29/18 97.8 kg     GEN:  Well nourished, well  developed in no acute distress HEENT: Normal NECK: No JVD; No carotid bruits LYMPHATICS: No lymphadenopathy CARDIAC: RRR, no murmurs, no rubs, no gallops RESPIRATORY:  Clear to auscultation without rales, wheezing or rhonchi  ABDOMEN: Soft, non-tender, non-distended MUSCULOSKELETAL:  No edema; No deformity  SKIN: Warm and dry LOWER EXTREMITIES: no swelling NEUROLOGIC:  Alert and oriented x 3 PSYCHIATRIC:  Normal  affect   ASSESSMENT:    1. Nonspecific chest pain   2. Dyspnea on exertion    PLAN:    In order of problems listed above:  1. History of coronary artery bypass graft.  He comes today with symptoms suggestive of unstable angina.  We talked in length about what to do with the situation he tells me this is exactly the same thing he had before.  I think he will be best served by being admitted to the hospital with intention to rule out other explanation for his symptoms.  He denies having any fever or chills but does have some shortness of breath and cough, therefore COVID-19 need to be ruled out.  If this is not a cause for his symptoms then probably cardiac catheterization can be pursued.  His EKG does not have any acute changes. 2. Dyslipidemia.  He is taking Crestor 20 however for some reason it did not cross out from the list of the medication.  We will investigate that 3. Essential hypertension: Blood pressures well controlled.  Continue present management. 4. Frequent PVCs.  Noted on the EKG.   Medication Adjustments/Labs and Tests Ordered: Current medicines are reviewed at length with the patient today.  Concerns regarding medicines are outlined above.  Orders Placed This Encounter  Procedures  . SARS CORONAVIRUS 2 (TAT 6-24 HRS) Nasopharyngeal Nasopharyngeal Swab  . DG Chest 2 View  . Comprehensive metabolic panel  . CBC with Differential  . Brain natriuretic peptide  . Vital signs  . Cardiac monitoring  . Inpatient consult to Cardiology  ALL PATIENTS BEING  ADMITTED/HAVING PROCEDURES NEED COVID-19 SCREENING  . ED EKG  . EKG 12-Lead  . Place in observation (patient's expected length of stay will be less than 2 midnights)   Medication changes: No orders of the defined types were placed in this encounter.   Signed, Georgeanna Lea, MD, [facility] 05/01/2019 4:13 PM    [facility]  Addendum:   Talked with the patient while in the ED. He currently denies any chest pain.  -- admit orders placed -- COVID pending -- NPO with plans for cardiac cath tomorrow -- will hold on IV heparin as initial hsTn is negative  Signed, Laverda Page, NP-C 05/01/2019, 4:16 PM Pager: [phone]

## 2019-05-01 NOTE — Progress Notes (Signed)
Cardiology Office Note:    Date:  05/01/2019   ID:  Patrick Goodwin, DOB Dec 07, 1960, MRN [id number]  PCP:  Paulina Fusi, MD  Cardiologist:  Gypsy Balsam, MD    Referring MD: Paulina Fusi, MD   No chief complaint on file. I have shortness of breath and chest pain  History of Present Illness:    Patrick Goodwin is a 59 y.o. male positive history significant for coronary artery disease, status post coronary artery bypass graft also history of essential hypertension, anxiety, dyslipidemia symptomatic PVCs.  He called our office earlier today and requested to be seen.  He was apparently stable at the time but he has been having tightness in the chest which is exertional with minimal exertion hospital shortness of breath and palpitations for about 2 weeks.  This is to the point that he cannot work.  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  He is afraid to go to the backyard because of the sensation.  At the moment of my interview he is asymptomatic, denies having any chest pain, tightness, pressure, burning in the chest at the time of my interview.  Past Medical History:  Diagnosis Date  . Anginal pain (HCC)    noticed some chest discomfort today   . Anxiety    takes Alprazolam daily  . Bruises easily   . Chest pain 04/30/2015  . Chronic back pain   . Coronary artery disease   . Coronary artery disease involving native coronary artery with angina pectoris (HCC) 08/28/2014   Overview:  Conclusions 04/30/15: Diagnostic Procedure Summary Multivessel CAD. No change from prior study. All grafts are widely patent No angiographic culprit PCI target  . Essential hypertension 08/28/2014  . 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 GERD (gastroesophageal reflux disease)    takes Prilosec daily  . History of bronchitis    Jan 2014  . History of colon polyps   . Hx of CABG 04/18/2015  . Hyperlipidemia    takes Zetia and Lipitor daily  . Hypertension    takes Metoprolol daily  . Myocardial infarction (HCC)    x 3   . Peripheral edema    takes Furosemide daily  . PONV (postoperative nausea and vomiting)    states takes alot to get him to sleep  . Seasonal allergies    takes Claritin D prn    Past Surgical History:  Procedure Laterality Date  . ANTERIOR LAT LUMBAR FUSION N/A 03/23/2012   Procedure: ANTERIOR LATERAL LUMBAR FUSION 1 LEVEL;  Surgeon: Venita Lick, MD;  Location: [facility] OR;  Service: Orthopedics;  Laterality: N/A;  XLIF L3-4 WITH POSTERIOR SPINAL FUSION INTERBODY L3-4  . CARDIAC CATHETERIZATION  2001  . COLONOSCOPY    . CORONARY ANGIOPLASTY     3-4 stents placed  . CORONARY ARTERY BYPASS GRAFT  1994   x 4    Current Medications: Current Meds  Medication Sig  . acebutolol (SECTRAL) 200 MG capsule Take 200 mg by mouth as needed.  . ALPRAZolam (XANAX) 0.5 MG tablet Take 0.5 mg by mouth 3 (three) times daily as needed for anxiety.  Marland Kitchen aspirin EC 81 MG tablet Take 1 tablet (81 mg total) by mouth daily.  . clopidogrel (PLAVIX) 75 MG tablet Take 75 mg by mouth daily.  Marland Kitchen ezetimibe (ZETIA) 10 MG tablet Take 10 mg by mouth daily.  . furosemide (  LASIX) 20 MG tablet Take 20 mg by mouth daily.  Marland Kitchen lisinopril (PRINIVIL,ZESTRIL) 10 MG tablet Take 10 mg by mouth daily.  . metoprolol tartrate (LOPRESSOR) 50 MG tablet TAKE 1 TABLET(50 MG) BY MOUTH TWICE DAILY  . nitroGLYCERIN (NITROLINGUAL) 0.4 MG/SPRAY spray Place 1 spray under the tongue every 5 (five) minutes x 3 doses as needed for chest pain.  Marland Kitchen omeprazole (PRILOSEC) 20 MG capsule Take 20 mg by mouth daily.  . ranolazine (RANEXA) 1000 MG SR tablet Take 1,000 mg by mouth 2 (two) times daily.  . [DISCONTINUED] rosuvastatin (CRESTOR) 20 MG tablet Take 20 mg by mouth daily.     Allergies:   Imdur [isosorbide nitrate]   Social History   Socioeconomic History  . Marital status: Married    Spouse name: Not on file  . Number of  children: Not on file  . Years of education: Not on file  . Highest education level: Not on file  Occupational History  . Not on file  Tobacco Use  . Smoking status: Former Research scientist (life sciences)  . Smokeless tobacco: Never Used  . Tobacco comment: quit smoking 12+yrs ago  Substance and Sexual Activity  . Alcohol use: No  . Drug use: No  . Sexual activity: Yes  Other Topics Concern  . Not on file  Social History Narrative  . Not on file   Social Determinants of Health   Financial Resource Strain:   . Difficulty of Paying Living Expenses:   Food Insecurity:   . Worried About Charity fundraiser in the Last Year:   . Arboriculturist in the Last Year:   Transportation Needs:   . Film/video editor (Medical):   Marland Kitchen Lack of Transportation (Non-Medical):   Physical Activity:   . Days of Exercise per Week:   . Minutes of Exercise per Session:   Stress:   . Feeling of Stress :   Social Connections:   . Frequency of Communication with Friends and Family:   . Frequency of Social Gatherings with Friends and Family:   . Attends Religious Services:   . Active Member of Clubs or Organizations:   . Attends Archivist Meetings:   Marland Kitchen Marital Status:      Family History: The patient's family history includes CAD in his mother; Heart attack in his father. ROS:   Please see the history of present illness.    All 14 point review of systems negative except as described per history of present illness  EKGs/Labs/Other Studies Reviewed:      Recent Labs: No results found for requested labs within last 8760 hours.  Recent Lipid Panel No results found for: CHOL, TRIG, HDL, CHOLHDL, VLDL, LDLCALC, LDLDIRECT  Physical Exam:    VS:  BP 134/78   Pulse 60   Ht 5\' 7"  (1.702 m)   Wt 224 lb 6.4 oz (101.8 kg)   SpO2 99%   BMI 35.15 kg/m     Wt Readings from Last 3 Encounters:  05/01/19 224 lb 6.4 oz (101.8 kg)  09/13/18 215 lb (97.5 kg)  03/29/18 215 lb 9.6 oz (97.8 kg)     GEN:  Well  nourished, well developed in no acute distress HEENT: Normal NECK: No JVD; No carotid bruits LYMPHATICS: No lymphadenopathy CARDIAC: RRR, no murmurs, no rubs, no gallops RESPIRATORY:  Clear to auscultation without rales, wheezing or rhonchi  ABDOMEN: Soft, non-tender, non-distended MUSCULOSKELETAL:  No edema; No deformity  SKIN: Warm and dry LOWER EXTREMITIES: no  swelling NEUROLOGIC:  Alert and oriented x 3 PSYCHIATRIC:  Normal affect   ASSESSMENT:    1. Hx of CABG   2. Mixed hyperlipidemia   3. Coronary artery disease involving native coronary artery of native heart with angina pectoris (HCC)   4. Essential hypertension   5. Frequent PVCs    PLAN:    In order of problems listed above:  1. History of coronary artery bypass graft.  He comes today with symptoms suggestive of unstable angina.  We talked in length about what to do with the situation he tells me this is exactly the same thing he had before.  I think he will be best served by being admitted to the hospital with intention to rule out other explanation for his symptoms.  He denies having any fever or chills but does have some shortness of breath and cough, therefore COVID-19 need to be ruled out.  If this is not a cause for his symptoms then probably cardiac catheterization can be pursued.  His EKG does not have any acute changes. 2. Dyslipidemia.  He is taking Crestor 20 however for some reason it did not cross out from the list of the medication.  We will investigate that 3. Essential hypertension: Blood pressures well controlled.  Continue present management. 4. Frequent PVCs.  Noted on the EKG.   Medication Adjustments/Labs and Tests Ordered: Current medicines are reviewed at length with the patient today.  Concerns regarding medicines are outlined above.  No orders of the defined types were placed in this encounter.  Medication changes: No orders of the defined types were placed in this encounter.   Signed, Georgeanna Lea, MD, [facility] 05/01/2019 11:19 AM    [facility]

## 2019-05-02 ENCOUNTER — Ambulatory Visit (HOSPITAL_COMMUNITY)
Admission: RE | Admit: 2019-05-02 | Payer: No Typology Code available for payment source | Source: Home / Self Care | Admitting: Cardiovascular Disease

## 2019-05-02 ENCOUNTER — Encounter (HOSPITAL_COMMUNITY): Payer: Self-pay | Admitting: Cardiovascular Disease

## 2019-05-02 ENCOUNTER — Other Ambulatory Visit: Payer: Self-pay | Admitting: Cardiology

## 2019-05-02 ENCOUNTER — Encounter (HOSPITAL_COMMUNITY)
Admission: EM | Disposition: A | Discharge status: Home / Self Care | Payer: No Typology Code available for payment source | Source: Home / Self Care | Attending: Emergency Medicine

## 2019-05-02 ENCOUNTER — Telehealth: Payer: Self-pay | Admitting: *Deleted

## 2019-05-02 ENCOUNTER — Observation Stay (HOSPITAL_BASED_OUTPATIENT_CLINIC_OR_DEPARTMENT_OTHER): Payer: No Typology Code available for payment source

## 2019-05-02 DIAGNOSIS — E785 Hyperlipidemia, unspecified: Secondary | ICD-10-CM | POA: Diagnosis not present

## 2019-05-02 DIAGNOSIS — I25118 Atherosclerotic heart disease of native coronary artery with other forms of angina pectoris: Secondary | ICD-10-CM | POA: Diagnosis not present

## 2019-05-02 DIAGNOSIS — R079 Chest pain, unspecified: Secondary | ICD-10-CM | POA: Diagnosis not present

## 2019-05-02 DIAGNOSIS — I208 Other forms of angina pectoris: Secondary | ICD-10-CM

## 2019-05-02 DIAGNOSIS — I252 Old myocardial infarction: Secondary | ICD-10-CM | POA: Diagnosis not present

## 2019-05-02 DIAGNOSIS — I25718 Atherosclerosis of autologous vein coronary artery bypass graft(s) with other forms of angina pectoris: Secondary | ICD-10-CM | POA: Diagnosis not present

## 2019-05-02 DIAGNOSIS — I10 Essential (primary) hypertension: Secondary | ICD-10-CM | POA: Diagnosis not present

## 2019-05-02 DIAGNOSIS — I493 Ventricular premature depolarization: Secondary | ICD-10-CM

## 2019-05-02 HISTORY — PX: LEFT HEART CATH AND CORS/GRAFTS ANGIOGRAPHY: CATH118250

## 2019-05-02 HISTORY — PX: CORONARY STENT PLACEMENT: SHX1402

## 2019-05-02 HISTORY — PX: CORONARY STENT INTERVENTION: CATH118234

## 2019-05-02 LAB — ECHOCARDIOGRAM COMPLETE
Height: 67 in
Weight: 3512 oz

## 2019-05-02 LAB — LIPID PANEL
Cholesterol: 183 mg/dL (ref 0–200)
HDL: 28 mg/dL — ABNORMAL LOW (ref >40)
LDL Cholesterol: 107 mg/dL — ABNORMAL HIGH (ref 0–99)
Total CHOL/HDL Ratio: 6.5 RATIO
Triglycerides: 239 mg/dL — ABNORMAL HIGH (ref <150)
VLDL: 48 mg/dL — ABNORMAL HIGH (ref 0–40)

## 2019-05-02 LAB — POCT ACTIVATED CLOTTING TIME: Activated Clotting Time: 246 seconds

## 2019-05-02 SURGERY — LEFT HEART CATH AND CORS/GRAFTS ANGIOGRAPHY
Anesthesia: LOCAL

## 2019-05-02 MED ORDER — HEPARIN SODIUM (PORCINE) 1000 UNIT/ML IJ SOLN
INTRAMUSCULAR | Status: AC
  Filled 2019-05-02: qty 1

## 2019-05-02 MED ORDER — HEPARIN SODIUM (PORCINE) 1000 UNIT/ML IJ SOLN
INTRAMUSCULAR | Status: DC | PRN
  Administered 2019-05-02: 5000 [IU] via INTRAVENOUS
  Administered 2019-05-02: 2000 [IU] via INTRAVENOUS
  Administered 2019-05-02: 5000 [IU] via INTRAVENOUS

## 2019-05-02 MED ORDER — MIDAZOLAM HCL 2 MG/2ML IJ SOLN
INTRAMUSCULAR | Status: DC | PRN
  Administered 2019-05-02 (×3): 1 mg via INTRAVENOUS

## 2019-05-02 MED ORDER — MIDAZOLAM HCL 2 MG/2ML IJ SOLN
INTRAMUSCULAR | Status: AC
  Filled 2019-05-02: qty 2

## 2019-05-02 MED ORDER — LIDOCAINE HCL (PF) 1 % IJ SOLN
INTRAMUSCULAR | Status: DC | PRN
  Administered 2019-05-02: 2 mL

## 2019-05-02 MED ORDER — IOHEXOL 350 MG/ML SOLN
INTRAVENOUS | Status: AC
  Filled 2019-05-02: qty 1

## 2019-05-02 MED ORDER — LIDOCAINE HCL (PF) 1 % IJ SOLN
INTRAMUSCULAR | Status: AC
  Filled 2019-05-02: qty 30

## 2019-05-02 MED ORDER — CLOPIDOGREL BISULFATE 75 MG PO TABS: 75.0000 mg | ORAL_TABLET | Freq: Every day | ORAL | 2 refills | Status: AC

## 2019-05-02 MED ORDER — IOHEXOL 350 MG/ML SOLN
INTRAVENOUS | Status: DC | PRN
  Administered 2019-05-02: 130 mL

## 2019-05-02 MED ORDER — ROSUVASTATIN CALCIUM 5 MG PO TABS: 10.0000 mg | ORAL_TABLET | Freq: Every day | ORAL | Status: DC

## 2019-05-02 MED ORDER — SODIUM CHLORIDE 0.9% FLUSH: 3.0000 mL | INTRAVENOUS | Status: DC | PRN

## 2019-05-02 MED ORDER — FENTANYL CITRATE (PF) 100 MCG/2ML IJ SOLN
INTRAMUSCULAR | Status: AC
  Filled 2019-05-02: qty 2

## 2019-05-02 MED ORDER — VERAPAMIL HCL 2.5 MG/ML IV SOLN
INTRAVENOUS | Status: DC | PRN
  Administered 2019-05-02: 10 mL via INTRA_ARTERIAL

## 2019-05-02 MED ORDER — VERAPAMIL HCL 2.5 MG/ML IV SOLN
INTRAVENOUS | Status: AC
  Filled 2019-05-02: qty 2

## 2019-05-02 MED ORDER — PANTOPRAZOLE SODIUM 40 MG PO TBEC: 40.0000 mg | DELAYED_RELEASE_TABLET | Freq: Every day | ORAL | 1 refills | Status: DC

## 2019-05-02 MED ORDER — SODIUM CHLORIDE 0.9% FLUSH
3.0000 mL | Freq: Two times a day (BID) | INTRAVENOUS | Status: DC
  Administered 2019-05-02: 13:00:00 3 mL via INTRAVENOUS

## 2019-05-02 MED ORDER — ROSUVASTATIN CALCIUM 10 MG PO TABS: 10.0000 mg | ORAL_TABLET | Freq: Every day | ORAL | 1 refills | Status: DC

## 2019-05-02 MED ORDER — FENTANYL CITRATE (PF) 100 MCG/2ML IJ SOLN
INTRAMUSCULAR | Status: DC | PRN
  Administered 2019-05-02: 25 ug via INTRAVENOUS
  Administered 2019-05-02 (×2): 50 ug via INTRAVENOUS

## 2019-05-02 MED ORDER — HEPARIN (PORCINE) IN NACL 1000-0.9 UT/500ML-% IV SOLN
INTRAVENOUS | Status: DC | PRN
  Administered 2019-05-02: 500 mL

## 2019-05-02 MED ORDER — SODIUM CHLORIDE 0.9 % IV SOLN: INTRAVENOUS | Status: AC

## 2019-05-02 MED ORDER — CLOPIDOGREL BISULFATE 300 MG PO TABS
ORAL_TABLET | ORAL | Status: DC | PRN
  Administered 2019-05-02: 300 mg via ORAL

## 2019-05-02 MED ORDER — HEPARIN (PORCINE) IN NACL 1000-0.9 UT/500ML-% IV SOLN
INTRAVENOUS | Status: AC
  Filled 2019-05-02: qty 1000

## 2019-05-02 MED ORDER — SODIUM CHLORIDE 0.9 % IV SOLN: 250.0000 mL | INTRAVENOUS | Status: DC | PRN

## 2019-05-02 SURGICAL SUPPLY — 18 items
BALLN SAPPHIRE 2.5X12 (BALLOONS) ×2
BALLOON SAPPHIRE 2.5X12 (BALLOONS) IMPLANT
CATH INFINITI 5 FR AR1 MOD (CATHETERS) ×1 IMPLANT
CATH OPTITORQUE TIG 4.0 5F (CATHETERS) ×1 IMPLANT
CATHETER LAUNCHER 6FR MP1 (CATHETERS) ×1 IMPLANT
DEVICE RAD COMP TR BAND LRG (VASCULAR PRODUCTS) ×1 IMPLANT
GLIDESHEATH SLEND SS 6F .021 (SHEATH) ×1 IMPLANT
GUIDEWIRE INQWIRE 1.5J.035X260 (WIRE) IMPLANT
GUIDEWIRE PRESSURE COMET II (WIRE) ×1 IMPLANT
INQWIRE 1.5J .035X260CM (WIRE) ×2
KIT ENCORE 26 ADVANTAGE (KITS) ×1 IMPLANT
KIT ESSENTIALS PG (KITS) ×1 IMPLANT
KIT HEART LEFT (KITS) ×2 IMPLANT
PACK CARDIAC CATHETERIZATION (CUSTOM PROCEDURE TRAY) ×2 IMPLANT
STENT RESOLUTE ONYX 3.0X18 (Permanent Stent) ×1 IMPLANT
TRANSDUCER W/STOPCOCK (MISCELLANEOUS) ×2 IMPLANT
TUBING CIL FLEX 10 FLL-RA (TUBING) ×2 IMPLANT
WIRE RUNTHROUGH .014X180CM (WIRE) ×1 IMPLANT

## 2019-05-02 NOTE — Interval H&P Note (Signed)
Cath Lab Visit (complete for each Cath Lab visit)  Clinical Evaluation Leading to the Procedure:   ACS: No.  Non-ACS:    Anginal Classification: CCS III  Anti-ischemic medical therapy: Maximal Therapy (2 or more classes of medications)  Non-Invasive Test Results: No non-invasive testing performed  Prior CABG: Previous CABG      History and Physical Interval Note:  05/02/2019 8:31 AM  Patrick Goodwin  has presented today for surgery, with the diagnosis of chest pain.  The various methods of treatment have been discussed with the patient and family. After consideration of risks, benefits and other options for treatment, the patient has consented to  Procedure(s): LEFT HEART CATH AND CORS/GRAFTS ANGIOGRAPHY (N/A) as a surgical intervention.  The patient's history has been reviewed, patient examined, no change in status, stable for surgery.  I have reviewed the patient's chart and labs.  Questions were answered to the patient's satisfaction.     Lorine Bears

## 2019-05-02 NOTE — Discharge Summary (Signed)
Discharge Summary    Patient ID: Patrick Goodwin,  MRN: [id number], DOB/AGE: Aug 27, 1960 59 y.o.  Admit date: 05/01/2019 Discharge date: 05/02/2019  Primary Care Provider: Nicoletta Goodwin Primary Cardiologist: Patrick Campus, MD  Discharge Diagnoses    Principal Problem:   Stable angina [facility]) Active Problems:   Chest pain   Essential hypertension   Hyperlipidemia   Allergies Allergies  Allergen Reactions  . Imdur [Isosorbide Nitrate]     Real bad headache and blurred vision    Diagnostic Studies/Procedures    Cath: 05/02/19   Ost RCA to Prox RCA lesion is 100% stenosed.  Prox Cx lesion is 100% stenosed.  SVG graft was visualized by angiography.  Origin to Mid Graft lesion is 10% stenosed.  The left ventricular systolic function is normal.  LV end diastolic pressure is mildly elevated.  The left ventricular ejection fraction is 50-55% by visual estimate.  Ost LAD to Prox LAD lesion is 80% stenosed.  Mid LAD lesion is 100% stenosed.  LIMA graft was visualized by non-selective angiography and is normal in caliber.  The graft exhibits no disease.  Mid Graft lesion is 10% stenosed.  Dist Graft lesion is 40% stenosed.  RPDA lesion is 85% stenosed.  Origin to Prox Graft lesion is 80% stenosed.  Post intervention, there is a 0% residual stenosis.  A drug-eluting stent was successfully placed using a STENT RESOLUTE ONYX 3.0X18.   1.  Significant underlying three-vessel coronary artery disease with occluded native coronary arteries.  Patent grafts including LIMA to LAD, SVG to OM and SVG to right PDA.  Patent stents in the SVG to OM and SVG to right PDA.  However, there was significant ostial stenosis in SVG to RCA. 2.  Low normal LV systolic function with an EF of 50% with mildly elevated left ventricular end-diastolic pressure at 20 mmHg. 3.  Frequent PVCs noted throughout the case. 4.  Successful angioplasty and drug-eluting stent placement to the ostial  SVG to right PDA.  Recommendations: Continue dual antiplatelet therapy indefinitely. Aggressive treatment of risk factors.  The patient remembers having myalgia with some statins but does not remember the names.  I elected to add small dose rosuvastatin to see if he can tolerate this.  He is already on Zetia. Recommend an echocardiogram to better evaluate ejection fraction given frequent PVCs.  Consider outpatient Holter monitor to quantify the PVCs and determine if treatment of this is needed. The patient otherwise is a candidate for same-day PCI.  Diagnostic Dominance: Right  Intervention   Echo: 05/02/19  IMPRESSIONS    1. Left ventricular ejection fraction, by estimation, is 55 to 60%. The  left ventricle has normal function. The left ventricle has no regional  wall motion abnormalities. There is mild left ventricular hypertrophy.  Left ventricular diastolic parameters  are consistent with Grade I diastolic dysfunction (impaired relaxation).  2. Right ventricular systolic function is low normal. The right  ventricular size is normal. There is mildly elevated pulmonary artery  systolic pressure. The estimated right ventricular systolic pressure is  62.2 mmHg.  3. The mitral valve is abnormal. Trivial mitral valve regurgitation.  4. The aortic valve is tricuspid. Aortic valve regurgitation is not  visualized. Mild aortic valve sclerosis is present, with no evidence of  aortic valve stenosis.  5. The inferior vena cava is dilated in size with <50% respiratory  variability, suggesting right atrial pressure of 15 mmHg.  _____________   History of Present Illness  59 y.o. male PMH which is significant for coronary artery disease, status post coronary artery bypass graft also history of essential hypertension, anxiety, dyslipidemia symptomatic PVCs.  He called Dr. Vanetta Goodwin office the day of admission and requested to be seen.  He had been having tightness in the chest  which was exertional with minimal exertion, shortness of breath and palpitations for about 2 weeks. 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  Also noted symptoms while working outside in the yard at home. Frequent palpitations as well. He was sent to the ED for admission with plans for cardiac cath.   Hospital Course     hsTn remained negative. Underwent cardiac cath noted above with significant underlying three-vessel coronary artery disease with occluded native coronary arteries. Patent LIMA to LAD, SVG to OM and SVG to right PDA, along with patent stents in the SVG to OM and SVG to right PDA.  Noted to have new significant ostial stenosis in SVG to RCA. Successful PCI/DES to the ostial SVG-PDA. Plan for DAPT with ASA/Plavix for at least one year. Noted to have freq PVCs on telemetry, therefore will plan for outpatient Holter monitor to assess PVC burden. LDL 107, reported a hx of statin intolerance with myalgias. Agreeable to try Crestor  daily, continue Zetia. Echo showed normal EF with no rWMA noted, g1DD. Seen by cardiac rehab prior to discharge.   General: Well developed, well nourished, male appearing in no acute distress. Head: Normocephalic, atraumatic.  Neck: Supple without bruits, no JVD. Lungs:  Resp regular and unlabored, CTA. Heart: RRR, S1, S2, no S3, S4, or murmur; no rub. Abdomen: Soft, non-tender, non-distended with normoactive bowel sounds. No hepatomegaly. No rebound/guarding. No obvious abdominal masses. Extremities: No clubbing, cyanosis, edema. Distal pedal pulses are 2+ bilaterally. Left radial cath site stable without bruising or hematoma, TR band remains in place Neuro: Alert and oriented X 3. Moves all extremities spontaneously. Psych: Normal affect.  Patrick Goodwin was seen by Dr. Excell Goodwin and determined stable for discharge home. Follow up in the office has been arranged. Medications are listed below.     _____________  Discharge Vitals Blood pressure (!) 156/74, pulse 65, temperature 97.8 F (36.6 C), temperature source Oral, resp. rate 16, weight 99.6 kg, SpO2 97 %.  Filed Weights   05/02/19 0637  Weight: 99.6 kg    Labs & Radiologic Studies    CBC Recent Labs    05/01/19 1255 05/01/19 1855  WBC 6.5 6.8  NEUTROABS 4.3  --   HGB 14.0 13.8  HCT 42.8 42.4  MCV 89.7 89.5  PLT 253 241   Basic Metabolic Panel Recent Labs    16/10/96 1255  NA 139  K 3.9  CL 104  CO2 25  GLUCOSE 96  BUN 8  CREATININE 1.09  CALCIUM 8.7*   Liver Function Tests Recent Labs    05/01/19 1255  AST 25  ALT 23  ALKPHOS 65  BILITOT 1.0  PROT 6.3*  ALBUMIN 3.3*   No results for input(s): LIPASE, AMYLASE in the last 72 hours. Cardiac Enzymes No results for input(s): CKTOTAL, CKMB, CKMBINDEX, TROPONINI in the last 72 hours. BNP Invalid input(s): POCBNP D-Dimer No results for input(s): DDIMER in the last 72 hours. Hemoglobin A1C No results for input(s): HGBA1C in the last 72 hours. Fasting Lipid Panel Recent Labs    05/02/19 0206  CHOL 183  HDL 28*  LDLCALC 107*  TRIG 239*  CHOLHDL 6.5   Thyroid Function Tests No results for input(s): TSH, T4TOTAL, T3FREE, THYROIDAB in the last 72 hours.  Invalid input(s): FREET3 _____________  DG Chest 2 View  Result Date: 05/01/2019 CLINICAL DATA:  Chest pain EXAM: CHEST - 2 VIEW COMPARISON:  03/03/2018 FINDINGS: Cardiac shadow is within normal limits. Postsurgical changes are again seen and stable. The lungs are clear bilaterally. No focal infiltrate or effusion is noted. Bony structures show degenerative change of thoracic spine. IMPRESSION: No acute abnormality noted. Electronically Signed   By: Alcide Clever M.D.   On: 05/01/2019 13:33   CARDIAC CATHETERIZATION  Result Date: 05/02/2019  Ost RCA to Prox RCA lesion is 100% stenosed.  Prox Cx lesion is 100% stenosed.  SVG graft was visualized by angiography.  Origin to Mid Graft  lesion is 10% stenosed.  The left ventricular systolic function is normal.  LV end diastolic pressure is mildly elevated.  The left ventricular ejection fraction is 50-55% by visual estimate.  Ost LAD to Prox LAD lesion is 80% stenosed.  Mid LAD lesion is 100% stenosed.  LIMA graft was visualized by non-selective angiography and is normal in caliber.  The graft exhibits no disease.  Mid Graft lesion is 10% stenosed.  Dist Graft lesion is 40% stenosed.  RPDA lesion is 85% stenosed.  Origin to Prox Graft lesion is 80% stenosed.  Post intervention, there is a 0% residual stenosis.  A drug-eluting stent was successfully placed using a STENT RESOLUTE ONYX 3.0X18.  1.  Significant underlying three-vessel coronary artery disease with occluded native coronary arteries.  Patent grafts including LIMA to LAD, SVG to OM and SVG to right PDA.  Patent stents in the SVG to OM and SVG to right PDA.  However, there was significant ostial stenosis in SVG to RCA. 2.  Low normal LV systolic function with an EF of 50% with mildly elevated left ventricular end-diastolic pressure at 20 mmHg. 3.  Frequent PVCs noted throughout the case. 4.  Successful angioplasty and drug-eluting stent placement to the ostial SVG to right PDA. Recommendations: Continue dual antiplatelet therapy indefinitely. Aggressive treatment of risk factors.  The patient remembers having myalgia with some statins but does not remember the names.  I elected to add small dose rosuvastatin to see if he can tolerate this.  He is already on Zetia. Recommend an echocardiogram to better evaluate ejection fraction given frequent PVCs.  Consider outpatient Holter monitor to quantify the PVCs and determine if treatment of this is needed. The patient otherwise is a candidate for same-day PCI.   ECHOCARDIOGRAM COMPLETE  Result Date: 05/02/2019    ECHOCARDIOGRAM REPORT   Patient Name:   Patrick Goodwin Date of Exam: 05/02/2019 Medical Rec #:  [id number]     Height:        67.0 in Accession #:    [id number]    Weight:       219.5 lb Date of Birth:  09-16-1960      BSA:          2.104 m Patient Age:    59 years      BP:           170/71 mmHg Patient Gender: M             HR:           49 bpm. Exam Location:  Inpatient Procedure: 2D Echo, Color Doppler and Cardiac Doppler Indications:    R07.9* Chest  pain, unspecified  History:        Patient has prior history of Echocardiogram examinations, most                 recent 02/16/2018. Prior CABG; Risk Factors:Hypertension and                 Dyslipidemia.  Sonographer:    Irving Burton Senior RDCS Referring Phys: [phone] Mandalyn Pasqua B Cire Deyarmin IMPRESSIONS  1. Left ventricular ejection fraction, by estimation, is 55 to 60%. The left ventricle has normal function. The left ventricle has no regional wall motion abnormalities. There is mild left ventricular hypertrophy. Left ventricular diastolic parameters are consistent with Grade I diastolic dysfunction (impaired relaxation).  2. Right ventricular systolic function is low normal. The right ventricular size is normal. There is mildly elevated pulmonary artery systolic pressure. The estimated right ventricular systolic pressure is 31.2 mmHg.  3. The mitral valve is abnormal. Trivial mitral valve regurgitation.  4. The aortic valve is tricuspid. Aortic valve regurgitation is not visualized. Mild aortic valve sclerosis is present, with no evidence of aortic valve stenosis.  5. The inferior vena cava is dilated in size with <50% respiratory variability, suggesting right atrial pressure of 15 mmHg. FINDINGS  Left Ventricle: Left ventricular ejection fraction, by estimation, is 55 to 60%. The left ventricle has normal function. The left ventricle has no regional wall motion abnormalities. The left ventricular internal cavity size was normal in size. There is  mild left ventricular hypertrophy. Left ventricular diastolic parameters are consistent with Grade I diastolic dysfunction (impaired relaxation).  Indeterminate filling pressures. Right Ventricle: The right ventricular size is normal. No increase in right ventricular wall thickness. Right ventricular systolic function is low normal. There is mildly elevated pulmonary artery systolic pressure. The tricuspid regurgitant velocity is 2.01 m/s, and with an assumed right atrial pressure of 15 mmHg, the estimated right ventricular systolic pressure is 31.2 mmHg. Left Atrium: Left atrial size was normal in size. Right Atrium: Right atrial size was normal in size. Pericardium: There is no evidence of pericardial effusion. Presence of pericardial fat pad. Mitral Valve: The mitral valve is abnormal. There is mild thickening of the mitral valve leaflet(s). Moderate mitral annular calcification. Trivial mitral valve regurgitation. Tricuspid Valve: The tricuspid valve is grossly normal. Tricuspid valve regurgitation is trivial. Aortic Valve: The aortic valve is tricuspid. Aortic valve regurgitation is not visualized. Mild aortic valve sclerosis is present, with no evidence of aortic valve stenosis. Mild to moderate aortic valve annular calcification. Pulmonic Valve: The pulmonic valve was not well visualized. Pulmonic valve regurgitation is not visualized. Aorta: The aortic root and ascending aorta are structurally normal, with no evidence of dilitation. Venous: The inferior vena cava is dilated in size with less than 50% respiratory variability, suggesting right atrial pressure of 15 mmHg. IAS/Shunts: No atrial level shunt detected by color flow Doppler.  LEFT VENTRICLE PLAX 2D LVIDd:         5.20 cm  Diastology LVIDs:         3.50 cm  LV e' lateral:   8.49 cm/s LV PW:         1.20 cm  LV E/e' lateral: 11.1 LV IVS:        1.10 cm  LV e' medial:    6.42 cm/s LVOT diam:     2.00 cm  LV E/e' medial:  14.7 LV SV:         92 LV SV Index:   44 LVOT Area:  3.14 cm  RIGHT VENTRICLE RV S prime:     9.25 cm/s TAPSE (M-mode): 1.9 cm LEFT ATRIUM             Index       RIGHT  ATRIUM           Index LA diam:        4.00 cm 1.90 cm/m  RA Area:     18.80 cm LA Vol (A2C):   65.9 ml 31.32 ml/m RA Volume:   48.70 ml  23.15 ml/m LA Vol (A4C):   60.8 ml 28.90 ml/m LA Biplane Vol: 65.1 ml 30.94 ml/m  AORTIC VALVE LVOT Vmax:   124.00 cm/s LVOT Vmean:  83.000 cm/s LVOT VTI:    0.292 m  AORTA Ao Root diam: 2.80 cm MITRAL VALVE               TRICUSPID VALVE MV Area (PHT): 3.08 cm    TR Peak grad:   16.2 mmHg MV Decel Time: 246 msec    TR Vmax:        201.00 cm/s MV E velocity: 94.60 cm/s MV A velocity: 85.90 cm/s  SHUNTS MV E/A ratio:  1.10        Systemic VTI:  0.29 m                            Systemic Diam: 2.00 cm Zoila Shutter MD Electronically signed by Zoila Shutter MD Signature Date/Time: 05/02/2019/1:09:42 PM    Final    Disposition   Pt is being discharged home today in good condition.  Follow-up Plans & Appointments   Follow-up Information    Georgeanna Lea, MD Follow up on 05/16/2019.   Specialty: Cardiology Why: at 2:55pm for your follow up appt.  Contact information: [address] [place] [address] [phone]          Discharge Instructions    Amb Referral to Cardiac Rehabilitation   Complete by: As directed    Will send Cardiac Phase 2 referral to [place]   Diagnosis: Coronary Stents   After initial evaluation and assessments completed: Virtual Based Care may be provided alone or in conjunction with Phase 2 Cardiac Rehab based on patient barriers.: Yes   Call MD for:  redness, tenderness, or signs of infection (pain, swelling, redness, odor or green/yellow discharge around incision site)   Complete by: As directed    Diet - low sodium heart healthy   Complete by: As directed    Discharge instructions   Complete by: As directed    Radial Site Care Refer to this sheet in the next few weeks. These instructions provide you with information on caring for yourself after your procedure. Your caregiver may also give you more specific  instructions. Your treatment has been planned according to current medical practices, but problems sometimes occur. Call your caregiver if you have any problems or questions after your procedure. HOME CARE INSTRUCTIONS You may shower the day after the procedure.Remove the bandage (dressing) and gently wash the site with plain soap and water.Gently pat the site dry.  Do not apply powder or lotion to the site.  Do not submerge the affected site in water for 3 to 5 days.  Inspect the site at least twice daily.  Do not flex or bend the affected arm for 24 hours.  No lifting over 5 pounds (2.3 kg) for 5 days after your procedure.  Do not drive home if you are discharged the same day of the procedure. Have someone else drive you.  You may drive 24 hours after the procedure unless otherwise instructed by your caregiver.  What to expect: Any bruising will usually fade within 1 to 2 weeks.  Blood that collects in the tissue (hematoma) may be painful to the touch. It should usually decrease in size and tenderness within 1 to 2 weeks.  SEEK IMMEDIATE MEDICAL CARE IF: You have unusual pain at the radial site.  You have redness, warmth, swelling, or pain at the radial site.  You have drainage (other than a small amount of blood on the dressing).  You have chills.  You have a fever or persistent symptoms for more than 72 hours.  You have a fever and your symptoms suddenly get worse.  Your arm becomes pale, cool, tingly, or numb.  You have heavy bleeding from the site. Hold pressure on the site.   PLEASE DO NOT MISS ANY DOSES OF YOUR PLAVIX!!!!! Also keep a log of you blood pressures and bring back to your follow up appt. Please call the office with any questions.   Patients taking blood thinners should generally stay away from medicines like ibuprofen, Advil, Motrin, naproxen, and Aleve due to risk of stomach bleeding. You may take Tylenol as directed or talk to your primary doctor about  alternatives.  Some studies suggest Prilosec/Omeprazole interacts with Plavix. We changed your Prilosec/Omeprazole to the equivalent dose of Protonix for less chance of interaction.   Increase activity slowly   Complete by: As directed      Discharge Medications     Medication List    STOP taking these medications   omeprazole 20 MG capsule Commonly known as: PRILOSEC     TAKE these medications   acebutolol 200 MG capsule Commonly known as: SECTRAL Take 200 mg by mouth as needed.   ALPRAZolam 0.5 MG tablet Commonly known as: XANAX Take 0.5 mg by mouth 2 (two) times daily as needed for anxiety.   aspirin EC 81 MG tablet Take 1 tablet (81 mg total) by mouth daily.   clopidogrel 75 MG tablet Commonly known as: PLAVIX Take 75 mg by mouth daily.   ezetimibe 10 MG tablet Commonly known as: ZETIA Take 10 mg by mouth daily.   furosemide 20 MG tablet Commonly known as: LASIX Take 20 mg by mouth daily.   lisinopril 10 MG tablet Commonly known as: ZESTRIL Take 10 mg by mouth daily.   metoprolol tartrate 50 MG tablet Commonly known as: LOPRESSOR TAKE 1 TABLET(50 MG) BY MOUTH TWICE DAILY What changed: See the new instructions.   nitroGLYCERIN 0.4 MG/SPRAY spray Commonly known as: NITROLINGUAL Place 1 spray under the tongue every 5 (five) minutes x 3 doses as needed for chest pain.   pantoprazole 40 MG tablet Commonly known as: Protonix Take 1 tablet (40 mg total) by mouth daily.   ranolazine 1000 MG SR tablet Commonly known as: RANEXA Take 1,000 mg by mouth 2 (two) times daily.   rosuvastatin 10 MG tablet Commonly known as: CRESTOR Take 1 tablet (10 mg total) by mouth daily at 6 PM.       No                               Did the patient have a percutaneous coronary intervention (stent / angioplasty)?:  Yes.     Cath/PCI Registry  Performance & Quality Measures: 1. Aspirin prescribed? - Yes 2. ADP Receptor Inhibitor (Plavix/Clopidogrel, Brilinta/Ticagrelor  or Effient/Prasugrel) prescribed (includes medically managed patients)? - Yes 3. High Intensity Statin (Lipitor 40-80mg  or Crestor 20-40mg ) prescribed? - No - statin intolerant, agreeable to start crestor  4. For EF <40%, was ACEI/ARB prescribed? - Yes 5. For EF <40%, Aldosterone Antagonist (Spironolactone or Eplerenone) prescribed? - Not Applicable (EF >/= 40%) 6. Cardiac Rehab Phase II ordered (Included Medically managed Patients)? - Yes    Outstanding Labs/Studies   FLP/LFTs in 8 weeks.   Duration of Discharge Encounter   Greater than 30 minutes including physician time.  Signed, Laverda Page NP-C 05/02/2019, 2:24 PM  Patient seen, examined. Available data reviewed. Agree with findings, assessment, and plan as outlined by Laverda Page, NP.  My exam findings are documented above.  I have personally reviewed the patient's cardiac catheterization images and he underwent successful PCI with stenting to the ostium of the saphenous vein graft RCA territory.  We discussed the importance of adherence to dual antiplatelet therapy with aspirin and clopidogrel.  Would favor at least 12 months of therapy and consider long-term in this patient who has now undergone saphenous vein graft stenting.  An echocardiogram will be obtained to better assess LV function.  A Holter monitor will be obtained to assess PVC burden in the setting of frequent PVCs.  The patient appears stable for hospital discharge later today after a period of post PCI observation.  Tonny Bollman, M.D. 05/02/2019 2:24 PM

## 2019-05-02 NOTE — Telephone Encounter (Signed)
Patient enrolled for [employer] to mail a 3 day ZIO XT long term holter monitor to his home.  Instructions reviewed briefly as they are included in the monitor kit. 

## 2019-05-02 NOTE — Progress Notes (Signed)
Echocardiogram 2D Echocardiogram has been performed.  Patrick Goodwin 05/02/2019, 12:45 PM

## 2019-05-02 NOTE — Progress Notes (Signed)
CARDIAC REHAB PHASE I   Stent education completed with pt. Pt educated on importance of ASA and Plavix. Pt given heart healthy diet. Reviewed restrictions, site care, and exercise guidelines. Pt c/o 4/10 dull CP, MD and RN aware. Encouraged ambulation prior to d/c to ensure he was feeling ok. Will refer to CRP II [place].  [id number] Reynold Bowen, RN BSN 05/02/2019 11:37 AM

## 2019-05-04 ENCOUNTER — Telehealth (HOSPITAL_COMMUNITY): Payer: Self-pay

## 2019-05-04 ENCOUNTER — Ambulatory Visit: Payer: No Typology Code available for payment source | Admitting: Cardiology

## 2019-05-04 NOTE — Telephone Encounter (Signed)
Faxed referral to [facility] for Cardiac Rehab. 

## 2019-05-07 ENCOUNTER — Other Ambulatory Visit: Payer: Self-pay | Admitting: Cardiology

## 2019-05-12 ENCOUNTER — Ambulatory Visit (INDEPENDENT_AMBULATORY_CARE_PROVIDER_SITE_OTHER): Payer: No Typology Code available for payment source

## 2019-05-12 DIAGNOSIS — I493 Ventricular premature depolarization: Secondary | ICD-10-CM

## 2019-05-16 ENCOUNTER — Other Ambulatory Visit: Payer: Self-pay

## 2019-05-16 ENCOUNTER — Encounter: Payer: Self-pay | Admitting: Cardiology

## 2019-05-16 ENCOUNTER — Ambulatory Visit (INDEPENDENT_AMBULATORY_CARE_PROVIDER_SITE_OTHER): Payer: No Typology Code available for payment source | Admitting: Cardiology

## 2019-05-16 VITALS — BP 124/74 | HR 64 | Ht 67.0 in | Wt 217.0 lb

## 2019-05-16 DIAGNOSIS — Z951 Presence of aortocoronary bypass graft: Secondary | ICD-10-CM

## 2019-05-16 DIAGNOSIS — I10 Essential (primary) hypertension: Secondary | ICD-10-CM

## 2019-05-16 DIAGNOSIS — I25119 Atherosclerotic heart disease of native coronary artery with unspecified angina pectoris: Secondary | ICD-10-CM

## 2019-05-16 MED ORDER — NITROGLYCERIN 0.4 MG/SPRAY TL SOLN: 1.0000 | 2 refills | Status: AC | PRN

## 2019-05-16 MED ORDER — ROSUVASTATIN CALCIUM 40 MG PO TABS: 40.0000 mg | ORAL_TABLET | Freq: Every day | ORAL | 3 refills | Status: AC

## 2019-05-16 NOTE — Progress Notes (Signed)
Cardiology Office Note:    Date:  05/16/2019   ID:  Patrick Goodwin, DOB [birth year], MRN [id number]  PCP:  Paulina Fusi, MD  Cardiologist:  Gypsy Balsam, MD    Referring MD: Paulina Fusi, MD   Chief Complaint  Patient presents with  . Follow-up  I am feeling much better  History of Present Illness:    Patrick Goodwin is a 59 y.o. male with past medical history significant for coronary artery disease, status post coronary bypass graft l years ago, I did see him at the beginning of April when he came to my office complaining of having some chest pain.  I elected to put him directly to hospital.  Cardiac catheterization was done he was find to have patent grafts except to the ostial lesion of graft going to PDA.  The graft was stented recovery was uncomplicated and he comes today to my office for follow-up.  Overall he is doing great he said he is feeling the best ever.  Before he was not able to do much because of his symptoms of tiredness fatigue now he is able to cut grass and in the matter-of-fact spends few days on the beach last week and he enjoyed himself tremendously.  He was able to walk climb stairs with no difficulties feeling very well.  Past Medical History:  Diagnosis Date  . Anginal pain (HCC)    noticed some chest discomfort today   . Anxiety    takes Alprazolam daily  . Bruises easily   . Chest pain 04/30/2015  . Chronic back pain   . Coronary artery disease   . Coronary artery disease involving native coronary artery with angina pectoris (HCC) 08/28/2014   Overview:  Conclusions 04/30/15: Diagnostic Procedure Summary Multivessel CAD. No change from prior study. All grafts are widely patent No angiographic culprit PCI target  . Essential hypertension 08/28/2014  . 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 GERD (gastroesophageal reflux disease)    takes Prilosec daily  . History of bronchitis    Jan 2014  . History of colon polyps   . Hx of CABG 04/18/2015  . Hyperlipidemia      takes Zetia and Lipitor daily  . Hypertension    takes Metoprolol daily  . Myocardial infarction (HCC)    x 3   . Peripheral edema    takes Furosemide daily  . PONV (postoperative nausea and vomiting)    states takes alot to get him to sleep  . Seasonal allergies    takes Claritin D prn    Past Surgical History:  Procedure Laterality Date  . ANTERIOR LAT LUMBAR FUSION N/A 03/23/2012   Procedure: ANTERIOR LATERAL LUMBAR FUSION 1 LEVEL;  Surgeon: Venita Lick, MD;  Location: [facility] OR;  Service: Orthopedics;  Laterality: N/A;  XLIF L3-4 WITH POSTERIOR SPINAL FUSION INTERBODY L3-4  . CARDIAC CATHETERIZATION  2001  . COLONOSCOPY    . CORONARY ANGIOPLASTY     3-4 stents placed  . CORONARY ARTERY BYPASS GRAFT  1994   x 4  . CORONARY STENT INTERVENTION N/A 05/02/2019   Procedure: CORONARY STENT INTERVENTION;  Surgeon: Iran Ouch, MD;  Location: [facility];  Service: Cardiovascular;  Laterality: N/A;  . CORONARY STENT PLACEMENT  05/02/2019  . LEFT HEART CATH AND CORS/GRAFTS ANGIOGRAPHY N/A 05/02/2019   Procedure: LEFT HEART CATH AND CORS/GRAFTS ANGIOGRAPHY;  Surgeon: Iran Ouch, MD;  [facility]: [facility];  Service: Cardiovascular;  Laterality: N/A;  Current Medications: Current Meds  Medication Sig  . ALPRAZolam (XANAX) 0.5 MG tablet Take 0.5 mg by mouth 2 (two) times daily as needed for anxiety.   Marland Kitchen aspirin EC 81 MG tablet Take 1 tablet (81 mg total) by mouth daily.  . clopidogrel (PLAVIX) 75 MG tablet Take 1 tablet (75 mg total) by mouth daily.  Marland Kitchen ezetimibe (ZETIA) 10 MG tablet Take 10 mg by mouth daily.  . furosemide (LASIX) 20 MG tablet Take 20 mg by mouth daily.  Marland Kitchen lisinopril (PRINIVIL,ZESTRIL) 10 MG tablet Take 10 mg by mouth daily.  . metoprolol tartrate (LOPRESSOR) 50 MG tablet Take 1 tablet (50 mg total) by mouth 2 (two) times daily.  . nitroGLYCERIN (NITROLINGUAL) 0.4 MG/SPRAY spray Place 1 spray under the tongue every 5 (five) minutes x 3 doses as  needed for chest pain.  . pantoprazole (PROTONIX) 40 MG tablet Take 1 tablet (40 mg total) by mouth daily.  . ranolazine (RANEXA) 1000 MG SR tablet Take 1,000 mg by mouth 2 (two) times daily.  . rosuvastatin (CRESTOR) 10 MG tablet Take 1 tablet (10 mg total) by mouth daily at 6 PM.     Allergies:   Imdur [isosorbide nitrate]   Social History   Socioeconomic History  . Marital status: Married    Spouse name: Not on file  . Number of children: Not on file  . Years of education: Not on file  . Highest education level: Not on file  Occupational History  . Not on file  Tobacco Use  . Smoking status: Former Research scientist (life sciences)  . Smokeless tobacco: Never Used  . Tobacco comment: quit smoking 12+yrs ago  Substance and Sexual Activity  . Alcohol use: No  . Drug use: No  . Sexual activity: Yes  Other Topics Concern  . Not on file  Social History Narrative  . Not on file   Social Determinants of Health   Financial Resource Strain:   . Difficulty of Paying Living Expenses:   Food Insecurity:   . Worried About Charity fundraiser in the Last Year:   . Arboriculturist in the Last Year:   Transportation Needs:   . Film/video editor (Medical):   Marland Kitchen Lack of Transportation (Non-Medical):   Physical Activity:   . Days of Exercise per Week:   . Minutes of Exercise per Session:   Stress:   . Feeling of Stress :   Social Connections:   . Frequency of Communication with Friends and Family:   . Frequency of Social Gatherings with Friends and Family:   . Attends Religious Services:   . Active Member of Clubs or Organizations:   . Attends Archivist Meetings:   Marland Kitchen Marital Status:      Family History: The patient's family history includes CAD in his mother; Heart attack in his father. ROS:   Please see the history of present illness.    All 14 point review of systems negative except as described per history of present illness  EKGs/Labs/Other Studies Reviewed:     Cardiac  catheterization description from 2 weeks ago showed: Significant underlying three-vessel coronary artery disease with occluded native coronary arteries.  Patent grafts including LIMA to LAD, SVG to OM and SVG to right PDA.  Patent stents in the SVG to OM and SVG to right PDA.  However, there was significant ostial stenosis in SVG to RCA. 2.  Low normal LV systolic function with an EF of 50% with mildly elevated  left ventricular end-diastolic pressure at 20 mmHg. 3.  Frequent PVCs noted throughout the case. 4.  Successful angioplasty and drug-eluting stent placement to the ostial SVG to right PDA.  Recommendations: Continue dual antiplatelet therapy indefinitely. Aggressive treatment of risk factors.  The patient remembers having myalgia with some statins but does not remember the names.  I elected to add small dose rosuvastatin to see if he can tolerate this.  He is already on Zetia. Recommend an echocardiogram to better evaluate ejection fraction given frequent PVCs.  Consider outpatient Holter monitor to quantify the PVCs and determine if treatment of this is needed. The patient otherwise is a candidate for same-day PCI. Recent Labs: 05/01/2019: ALT 23; B Natriuretic Peptide 83.3; BUN 8; Creatinine, Ser 1.09; Hemoglobin 13.8; Platelets 241; Potassium 3.9; Sodium 139  Recent Lipid Panel    Component Value Date/Time   CHOL 183 05/02/2019 0206   TRIG 239 (H) 05/02/2019 0206   HDL 28 (L) 05/02/2019 0206   CHOLHDL 6.5 05/02/2019 0206   VLDL 48 (H) 05/02/2019 0206   LDLCALC 107 (H) 05/02/2019 0206    Physical Exam:    VS:  BP 124/74   Pulse 64   Ht 5\' 7"  (1.702 m)   Wt 217 lb (98.4 kg)   SpO2 96%   BMI 33.99 kg/m     Wt Readings from Last 3 Encounters:  05/16/19 217 lb (98.4 kg)  05/02/19 219 lb 8 oz (99.6 kg)  05/01/19 224 lb 6.4 oz (101.8 kg)     GEN:  Well nourished, well developed in no acute distress HEENT: Normal NECK: No JVD; No carotid bruits LYMPHATICS: No  lymphadenopathy CARDIAC: RRR, no murmurs, no rubs, no gallops RESPIRATORY:  Clear to auscultation without rales, wheezing or rhonchi  ABDOMEN: Soft, non-tender, non-distended MUSCULOSKELETAL:  No edema; No deformity  SKIN: Warm and dry LOWER EXTREMITIES: no swelling NEUROLOGIC:  Alert and oriented x 3 PSYCHIATRIC:  Normal affect  Left wrist doing well.  ASSESSMENT:    1. Hx of CABG 1994   2. Coronary artery disease involving native coronary artery of native heart with angina pectoris (HCC)   3. Essential hypertension    PLAN:    In order of problems listed above:  1. Coronary disease status post coronary bypass grafting 1994, recent angioplasty of graft going to PDA.  Feeling dramatically better, on dual antiplatelet therapy for at least a year maybe even longer that will be indication to be for this scenario.  Doing very well we discussed all medications and the reason for it.  He agreed to continue taking those medications. 2. Essential hypertension.  His blood pressure today was 124/74.  Medications are appropriate which I will continue. 3. Dyslipidemia.  He is taking only 10 mg of Crestor he is a last fasting lipid profile showing LDL of 107 which was from 7 April.  I will increase Crestor to 40 mg daily we will check his fasting lipid profile in 6 weeks. 4. Overall he is feeling dramatically better have much more energy is able to do much more.  We will continue present management which include risk factors modifications.  We talked about need to exercise on a regular basis as well as good diet. Ventricular ectopy before.  Now he said he got absolutely no palpitations no extra beats.  We will continue monitoring  Medication Adjustments/Labs and Tests Ordered: Current medicines are reviewed at length with the patient today.  Concerns regarding medicines are outlined above.  No orders  of the defined types were placed in this encounter.  Medication changes: No orders of the defined  types were placed in this encounter.   Signed, Georgeanna Lea, MD, [facility] 05/16/2019 3:19 PM    [facility]

## 2019-05-16 NOTE — Patient Instructions (Signed)
Medication Instructions:  Increase Rosuvastatin to 40 mg daily   *If you need a refill on your cardiac medications before your next appointment, please call your pharmacy*   Lab Work: None ordered   If you have labs (blood work) drawn today and your tests are completely normal, you will receive your results only by: Marland Kitchen MyChart Message (if you have MyChart) OR . A paper copy in the mail If you have any lab test that is abnormal or we need to change your treatment, we will call you to review the results.   Testing/Procedures: None ordered    Follow-Up: At [facility], you and your health needs are our priority.  As part of our continuing mission to provide you with exceptional heart care, we have created designated Provider Care Teams.  These Care Teams include your primary Cardiologist (physician) and Advanced Practice Providers (APPs -  Physician Assistants and Nurse Practitioners) who all work together to provide you with the care you need, when you need it.  We recommend signing up for the patient portal called "MyChart".  Sign up information is provided on this After Visit Summary.  MyChart is used to connect with patients for Virtual Visits (Telemedicine).  Patients are able to view lab/test results, encounter notes, upcoming appointments, etc.  Non-urgent messages can be sent to your provider as well.   To learn more about what you can do with MyChart, go to [email or web address].    Your next appointment:   6 week(s)  The format for your next appointment:   In Person  Provider:   Gypsy Balsam, MD   Other Instructions None

## 2019-06-20 ENCOUNTER — Telehealth: Payer: Self-pay | Admitting: Cardiology

## 2019-06-20 NOTE — Telephone Encounter (Signed)
Accessed pt's chart to see who called him today. I then reached out to [facility] via secure chat and transferred the call.

## 2019-06-29 ENCOUNTER — Ambulatory Visit (INDEPENDENT_AMBULATORY_CARE_PROVIDER_SITE_OTHER): Payer: No Typology Code available for payment source | Admitting: Cardiology

## 2019-06-29 ENCOUNTER — Encounter: Payer: Self-pay | Admitting: Cardiology

## 2019-06-29 ENCOUNTER — Other Ambulatory Visit: Payer: Self-pay

## 2019-06-29 VITALS — BP 128/66 | HR 58 | Ht 67.0 in | Wt 222.0 lb

## 2019-06-29 DIAGNOSIS — R079 Chest pain, unspecified: Secondary | ICD-10-CM

## 2019-06-29 DIAGNOSIS — I739 Peripheral vascular disease, unspecified: Secondary | ICD-10-CM | POA: Insufficient documentation

## 2019-06-29 DIAGNOSIS — Z951 Presence of aortocoronary bypass graft: Secondary | ICD-10-CM | POA: Diagnosis not present

## 2019-06-29 DIAGNOSIS — I25119 Atherosclerotic heart disease of native coronary artery with unspecified angina pectoris: Secondary | ICD-10-CM

## 2019-06-29 DIAGNOSIS — E782 Mixed hyperlipidemia: Secondary | ICD-10-CM

## 2019-06-29 DIAGNOSIS — I10 Essential (primary) hypertension: Secondary | ICD-10-CM | POA: Diagnosis not present

## 2019-06-29 DIAGNOSIS — I493 Ventricular premature depolarization: Secondary | ICD-10-CM

## 2019-06-29 DIAGNOSIS — R002 Palpitations: Secondary | ICD-10-CM

## 2019-06-29 NOTE — Progress Notes (Signed)
Cardiology Office Note:    Date:  06/29/2019   ID:  Patrick Goodwin, DOB 28-Feb-1960, MRN [id number]  PCP:  Paulina Fusi, MD  Cardiologist:  Gypsy Balsam, MD    Referring MD: Paulina Fusi, MD   Chief Complaint  Patient presents with  . Follow-up    6 WK FU    palpitations  History of Present Illness:    Patrick Goodwin is a 59 y.o. male with past medical history significant for coronary artery disease he did have coronary artery bypass grafting many years ago.  In April of this year he came to me complaining of atypical chest pain.  He was sent to the hospital cardiac catheterization was done he was found to have ostial lesion of the graft going to PDA.  It was addressed with stenting after that his course was uncomplicated.  He recovered very nicely he feels dramatically better complaint that he is experiencing right now is palpitations he said he feels his heart skipping stopping from normal typically at evening time.  On top of that he complained of having weakness fatigue and tiredness weakness lower extremities.  He was told to have some back problem but he is worried it may be a vascular issue and he may be right.  Past Medical History:  Diagnosis Date  . Anginal pain (HCC)    noticed some chest discomfort today   . Anxiety    takes Alprazolam daily  . Bruises easily   . Chest pain 04/30/2015  . Chronic back pain   . Coronary artery disease   . Coronary artery disease involving native coronary artery with angina pectoris (HCC) 08/28/2014   Overview:  Conclusions 04/30/15: Diagnostic Procedure Summary Multivessel CAD. No change from prior study. All grafts are widely patent No angiographic culprit PCI target  . Essential hypertension 08/28/2014  . 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 GERD (gastroesophageal reflux disease)    takes Prilosec daily  . History of bronchitis    Jan 2014  . History of colon polyps   . Hx of CABG 04/18/2015  . Hyperlipidemia    takes Zetia and Lipitor  daily  . Hypertension    takes Metoprolol daily  . Myocardial infarction (HCC)    x 3   . Peripheral edema    takes Furosemide daily  . PONV (postoperative nausea and vomiting)    states takes alot to get him to sleep  . Seasonal allergies    takes Claritin D prn    Past Surgical History:  Procedure Laterality Date  . ANTERIOR LAT LUMBAR FUSION N/A 03/23/2012   Procedure: ANTERIOR LATERAL LUMBAR FUSION 1 LEVEL;  Surgeon: Venita Lick, MD;  Location: [facility] OR;  Service: Orthopedics;  Laterality: N/A;  XLIF L3-4 WITH POSTERIOR SPINAL FUSION INTERBODY L3-4  . CARDIAC CATHETERIZATION  2001  . COLONOSCOPY    . CORONARY ANGIOPLASTY     3-4 stents placed  . CORONARY ARTERY BYPASS GRAFT  1994   x 4  . CORONARY STENT INTERVENTION N/A 05/02/2019   Procedure: CORONARY STENT INTERVENTION;  Surgeon: Iran Ouch, MD;  Location: [facility] INVASIVE CV LAB;  Service: Cardiovascular;  Laterality: N/A;  . CORONARY STENT PLACEMENT  05/02/2019  . LEFT HEART CATH AND CORS/GRAFTS ANGIOGRAPHY N/A 05/02/2019   Procedure: LEFT HEART CATH AND CORS/GRAFTS ANGIOGRAPHY;  Surgeon: Iran Ouch, MD;  [facility]: [facility] LAB;  Service: Cardiovascular;  Laterality: N/A;    Current Medications: Current Meds  Medication Sig  .  acebutolol (SECTRAL) 200 MG capsule Take 200 mg by mouth as needed.  . ALPRAZolam (XANAX) 0.5 MG tablet Take 0.5 mg by mouth 2 (two) times daily as needed for anxiety.   Marland Kitchen aspirin EC 81 MG tablet Take 1 tablet (81 mg total) by mouth daily.  . clopidogrel (PLAVIX) 75 MG tablet Take 1 tablet (75 mg total) by mouth daily.  Marland Kitchen ezetimibe (ZETIA) 10 MG tablet Take 10 mg by mouth daily.  . furosemide (LASIX) 20 MG tablet Take 20 mg by mouth daily.  Marland Kitchen lisinopril (PRINIVIL,ZESTRIL) 10 MG tablet Take 10 mg by mouth daily.  . metoprolol tartrate (LOPRESSOR) 50 MG tablet Take 1 tablet (50 mg total) by mouth 2 (two) times daily.  . nitroGLYCERIN (NITROLINGUAL) 0.4 MG/SPRAY spray Place 1 spray under  the tongue every 5 (five) minutes x 3 doses as needed for chest pain.  . pantoprazole (PROTONIX) 40 MG tablet Take 1 tablet (40 mg total) by mouth daily.  . ranolazine (RANEXA) 1000 MG SR tablet Take 1,000 mg by mouth 2 (two) times daily.  . rosuvastatin (CRESTOR) 40 MG tablet Take 1 tablet (40 mg total) by mouth daily.     Allergies:   Imdur [isosorbide nitrate]   Social History   Socioeconomic History  . Marital status: Married    Spouse name: Not on file  . Number of children: Not on file  . Years of education: Not on file  . Highest education level: Not on file  Occupational History  . Not on file  Tobacco Use  . Smoking status: Former Games developer  . Smokeless tobacco: Never Used  . Tobacco comment: quit smoking 12+yrs ago  Substance and Sexual Activity  . Alcohol use: No  . Drug use: No  . Sexual activity: Yes  Other Topics Concern  . Not on file  Social History Narrative  . Not on file   Social Determinants of Health   Financial Resource Strain:   . Difficulty of Paying Living Expenses:   Food Insecurity:   . Worried About Programme researcher, broadcasting/film/video in the Last Year:   . Barista in the Last Year:   Transportation Needs:   . Freight forwarder (Medical):   Marland Kitchen Lack of Transportation (Non-Medical):   Physical Activity:   . Days of Exercise per Week:   . Minutes of Exercise per Session:   Stress:   . Feeling of Stress :   Social Connections:   . Frequency of Communication with Friends and Family:   . Frequency of Social Gatherings with Friends and Family:   . Attends Religious Services:   . Active Member of Clubs or Organizations:   . Attends Banker Meetings:   Marland Kitchen Marital Status:      Family History: The patient's family history includes CAD in his mother; Heart attack in his father. ROS:   Please see the history of present illness.    All 14 point review of systems negative except as described per history of present illness  EKGs/Labs/Other  Studies Reviewed:      Recent Labs: 05/01/2019: ALT 23; B Natriuretic Peptide 83.3; BUN 8; Creatinine, Ser 1.09; Hemoglobin 13.8; Platelets 241; Potassium 3.9; Sodium 139  Recent Lipid Panel    Component Value Date/Time   CHOL 183 05/02/2019 0206   TRIG 239 (H) 05/02/2019 0206   HDL 28 (L) 05/02/2019 0206   CHOLHDL 6.5 05/02/2019 0206   VLDL 48 (H) 05/02/2019 0206   LDLCALC 107 (H)  05/02/2019 0206    Physical Exam:    VS:  BP 128/66   Pulse (!) 58   Ht 5\' 7"  (1.702 m)   Wt 222 lb (100.7 kg)   SpO2 98%   BMI 34.77 kg/m     Wt Readings from Last 3 Encounters:  06/29/19 222 lb (100.7 kg)  05/16/19 217 lb (98.4 kg)  05/02/19 219 lb 8 oz (99.6 kg)     GEN:  Well nourished, well developed in no acute distress HEENT: Normal NECK: No JVD; No carotid bruits LYMPHATICS: No lymphadenopathy CARDIAC: RRR, no murmurs, no rubs, no gallops RESPIRATORY:  Clear to auscultation without rales, wheezing or rhonchi  ABDOMEN: Soft, non-tender, non-distended MUSCULOSKELETAL:  No edema; No deformity  SKIN: Warm and dry LOWER EXTREMITIES: no swelling NEUROLOGIC:  Alert and oriented x 3 PSYCHIATRIC:  Normal affect   ASSESSMENT:    1. Coronary artery disease involving native coronary artery of native heart with angina pectoris ([place])   2. Hx of CABG 1994   3. Chest pain, unspecified type   4. Essential hypertension   5. Frequent PVCs   6. Claudication in peripheral vascular disease ([place])    PLAN:    In order of problems listed above:  1. Coronary disease status post PTCA and stenting of the graft going to PDA done in April of this year.  Still on dual antiplatelet therapy will continue for at least 12 months. 2. Palpitations: I will schedule him to have Zio patch to see exactly what kind of arrhythmia therapy dealing with. 3. Claudications: We will schedule him to have arterial duplex of lower extremities. 4. Dyslipidemia: In spite of high intensity statin and Zetia he still have  cholesterol is acceptably high.  His LDL is 107 HDL 28 with triglycerides 239.  I will refer him to lipid clinic for more advanced therapy.  He may require PCSK9 agent. 5. We did talk about healthy lifestyle need to exercise on a regular basis.     Medication Adjustments/Labs and Tests Ordered: Current medicines are reviewed at length with the patient today.  Concerns regarding medicines are outlined above.  No orders of the defined types were placed in this encounter.  Medication changes: No orders of the defined types were placed in this encounter.   Signed, Park Liter, MD, [facility] 06/29/2019 3:10 PM    [address]

## 2019-06-29 NOTE — Patient Instructions (Signed)
Medication Instructions:  Your physician recommends that you continue on your current medications as directed. Please refer to the Current Medication list given to you today.  *If you need a refill on your cardiac medications before your next appointment, please call your pharmacy*   Lab Work: None.  If you have labs (blood work) drawn today and your tests are completely normal, you will receive your results only by: Marland Kitchen MyChart Message (if you have MyChart) OR . A paper copy in the mail If you have any lab test that is abnormal or we need to change your treatment, we will call you to review the results.   Testing/Procedures: A zio monitor was ordered today. It will remain on for 7 days. You will then return monitor and event diary in provided box. It takes 1-2 weeks for report to be downloaded and returned to [place]. We will call you with the results. If monitor falls off or has orange flashing light, please call Zio for further instructions.    Your physician has requested that you have a lower extremity arterial exercise duplex. During this test, exercise and ultrasound are used to evaluate arterial blood flow in the legs. Allow one hour for this exam. There are no restrictions or special instructions.  Follow-Up: At [facility], you and your health needs are our priority.  As part of our continuing mission to provide you with exceptional heart care, we have created designated Provider Care Teams.  These Care Teams include your primary Cardiologist (physician) and Advanced Practice Providers (APPs -  Physician Assistants and Nurse Practitioners) who all work together to provide you with the care you need, when you need it.  We recommend signing up for the patient portal called "MyChart".  Sign up information is provided on this After Visit Summary.  MyChart is used to connect with patients for Virtual Visits (Telemedicine).  Patients are able to view lab/test results, encounter notes,  upcoming appointments, etc.  Non-urgent messages can be sent to your provider as well.   To learn more about what you can do with MyChart, go to [email or web address].    Your next appointment:   3 month(s)  The format for your next appointment:   In Person  Provider:   Gypsy Balsam, MD   Other Instructions   Dr. Bing Matter has referred you to see the lipid clinic they should call you within 1 week for an appointment if not please call our office.

## 2019-06-30 ENCOUNTER — Other Ambulatory Visit: Payer: Self-pay | Admitting: Cardiology

## 2019-07-05 ENCOUNTER — Ambulatory Visit (INDEPENDENT_AMBULATORY_CARE_PROVIDER_SITE_OTHER): Payer: No Typology Code available for payment source

## 2019-07-05 DIAGNOSIS — I25119 Atherosclerotic heart disease of native coronary artery with unspecified angina pectoris: Secondary | ICD-10-CM | POA: Diagnosis not present

## 2019-07-05 DIAGNOSIS — R079 Chest pain, unspecified: Secondary | ICD-10-CM

## 2019-07-05 DIAGNOSIS — R002 Palpitations: Secondary | ICD-10-CM | POA: Diagnosis not present

## 2019-07-18 ENCOUNTER — Ambulatory Visit (INDEPENDENT_AMBULATORY_CARE_PROVIDER_SITE_OTHER): Payer: No Typology Code available for payment source

## 2019-07-18 ENCOUNTER — Other Ambulatory Visit: Payer: Self-pay

## 2019-07-18 DIAGNOSIS — I739 Peripheral vascular disease, unspecified: Secondary | ICD-10-CM | POA: Diagnosis not present

## 2019-07-18 NOTE — Progress Notes (Signed)
Lower extremity arterial duplex exam has been performed.  Jimmy Shyam Dawson RDCS, RVT 

## 2019-08-01 ENCOUNTER — Encounter: Payer: Self-pay | Admitting: General Practice

## 2019-08-15 ENCOUNTER — Other Ambulatory Visit: Payer: Self-pay | Admitting: Cardiology

## 2019-08-28 ENCOUNTER — Telehealth: Payer: Self-pay | Admitting: Cardiology

## 2019-08-28 DIAGNOSIS — I25119 Atherosclerotic heart disease of native coronary artery with unspecified angina pectoris: Secondary | ICD-10-CM

## 2019-08-28 NOTE — Telephone Encounter (Signed)
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 Please call to discuss

## 2019-08-28 NOTE — Telephone Encounter (Signed)
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

## 2019-08-29 NOTE — Telephone Encounter (Signed)
Moderate risk, we can continue with beta-blocker

## 2019-08-29 NOTE — Telephone Encounter (Signed)
Patient aware order has been placed and we will let him know when scheduled. Scheduler aware.

## 2019-08-29 NOTE — Telephone Encounter (Signed)
Called patient gave him the appointment date for his lexiscan and went over instructions with him as well. No further questions.

## 2019-09-05 ENCOUNTER — Telehealth (HOSPITAL_COMMUNITY): Payer: Self-pay | Admitting: *Deleted

## 2019-09-05 NOTE — Telephone Encounter (Signed)
Patient given detailed instructions per Myocardial Perfusion Study Information Sheet for the test on 09/12/19 at 7:45. Patient notified to arrive 15 minutes early and that it is imperative to arrive on time for appointment to keep from having the test rescheduled.  If you need to cancel or reschedule your appointment, please call the office within 24 hours of your appointment. . Patient verbalized understanding.Daneil Dolin

## 2019-09-12 ENCOUNTER — Ambulatory Visit: Payer: No Typology Code available for payment source

## 2019-09-12 ENCOUNTER — Ambulatory Visit (INDEPENDENT_AMBULATORY_CARE_PROVIDER_SITE_OTHER): Payer: No Typology Code available for payment source

## 2019-09-12 ENCOUNTER — Other Ambulatory Visit: Payer: Self-pay

## 2019-09-12 DIAGNOSIS — I25119 Atherosclerotic heart disease of native coronary artery with unspecified angina pectoris: Secondary | ICD-10-CM | POA: Diagnosis not present

## 2019-09-12 MED ORDER — TECHNETIUM TC 99M TETROFOSMIN IV KIT
10.8000 | PACK | Freq: Once | INTRAVENOUS | Status: AC | PRN
  Administered 2019-09-12: 10.8 via INTRAVENOUS

## 2019-09-12 MED ORDER — REGADENOSON 0.4 MG/5ML IV SOLN
0.4000 mg | Freq: Once | INTRAVENOUS | Status: AC
  Administered 2019-09-12: 0.4 mg via INTRAVENOUS

## 2019-09-12 MED ORDER — TECHNETIUM TC 99M TETROFOSMIN IV KIT
32.3000 | PACK | Freq: Once | INTRAVENOUS | Status: AC | PRN
  Administered 2019-09-12: 32.3 via INTRAVENOUS

## 2019-09-13 LAB — MYOCARDIAL PERFUSION IMAGING
LV dias vol: 101 mL (ref 62–150)
LV sys vol: 44 mL
Peak HR: 84 {beats}/min
Rest HR: 52 {beats}/min
SDS: 3
SRS: 7
SSS: 10
TID: 1

## 2019-09-26 ENCOUNTER — Telehealth: Payer: Self-pay | Admitting: Cardiology

## 2019-09-26 NOTE — Telephone Encounter (Signed)
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 Secure chatted Hayley B who will fax to the office [phone]

## 2019-09-27 ENCOUNTER — Encounter: Payer: Self-pay | Admitting: Emergency Medicine

## 2019-10-08 ENCOUNTER — Other Ambulatory Visit: Payer: Self-pay

## 2019-10-08 ENCOUNTER — Other Ambulatory Visit: Payer: Self-pay | Admitting: Family Medicine

## 2019-10-08 ENCOUNTER — Ambulatory Visit: Payer: Self-pay

## 2019-10-08 DIAGNOSIS — M898X2 Other specified disorders of bone, upper arm: Secondary | ICD-10-CM

## 2019-10-23 ENCOUNTER — Ambulatory Visit: Payer: No Typology Code available for payment source | Admitting: Cardiology

## 2020-02-09 DIAGNOSIS — I4892 Unspecified atrial flutter: Secondary | ICD-10-CM

## 2020-02-26 DEATH — deceased

## 2020-10-28 IMAGING — CR DG CHEST 2V
2 series · 2 of 2 positions shown · non-contrast
Comparison: 03/03/2018

CLINICAL DATA: Chest pain

EXAM:
CHEST - 2 VIEW

[chest pa]
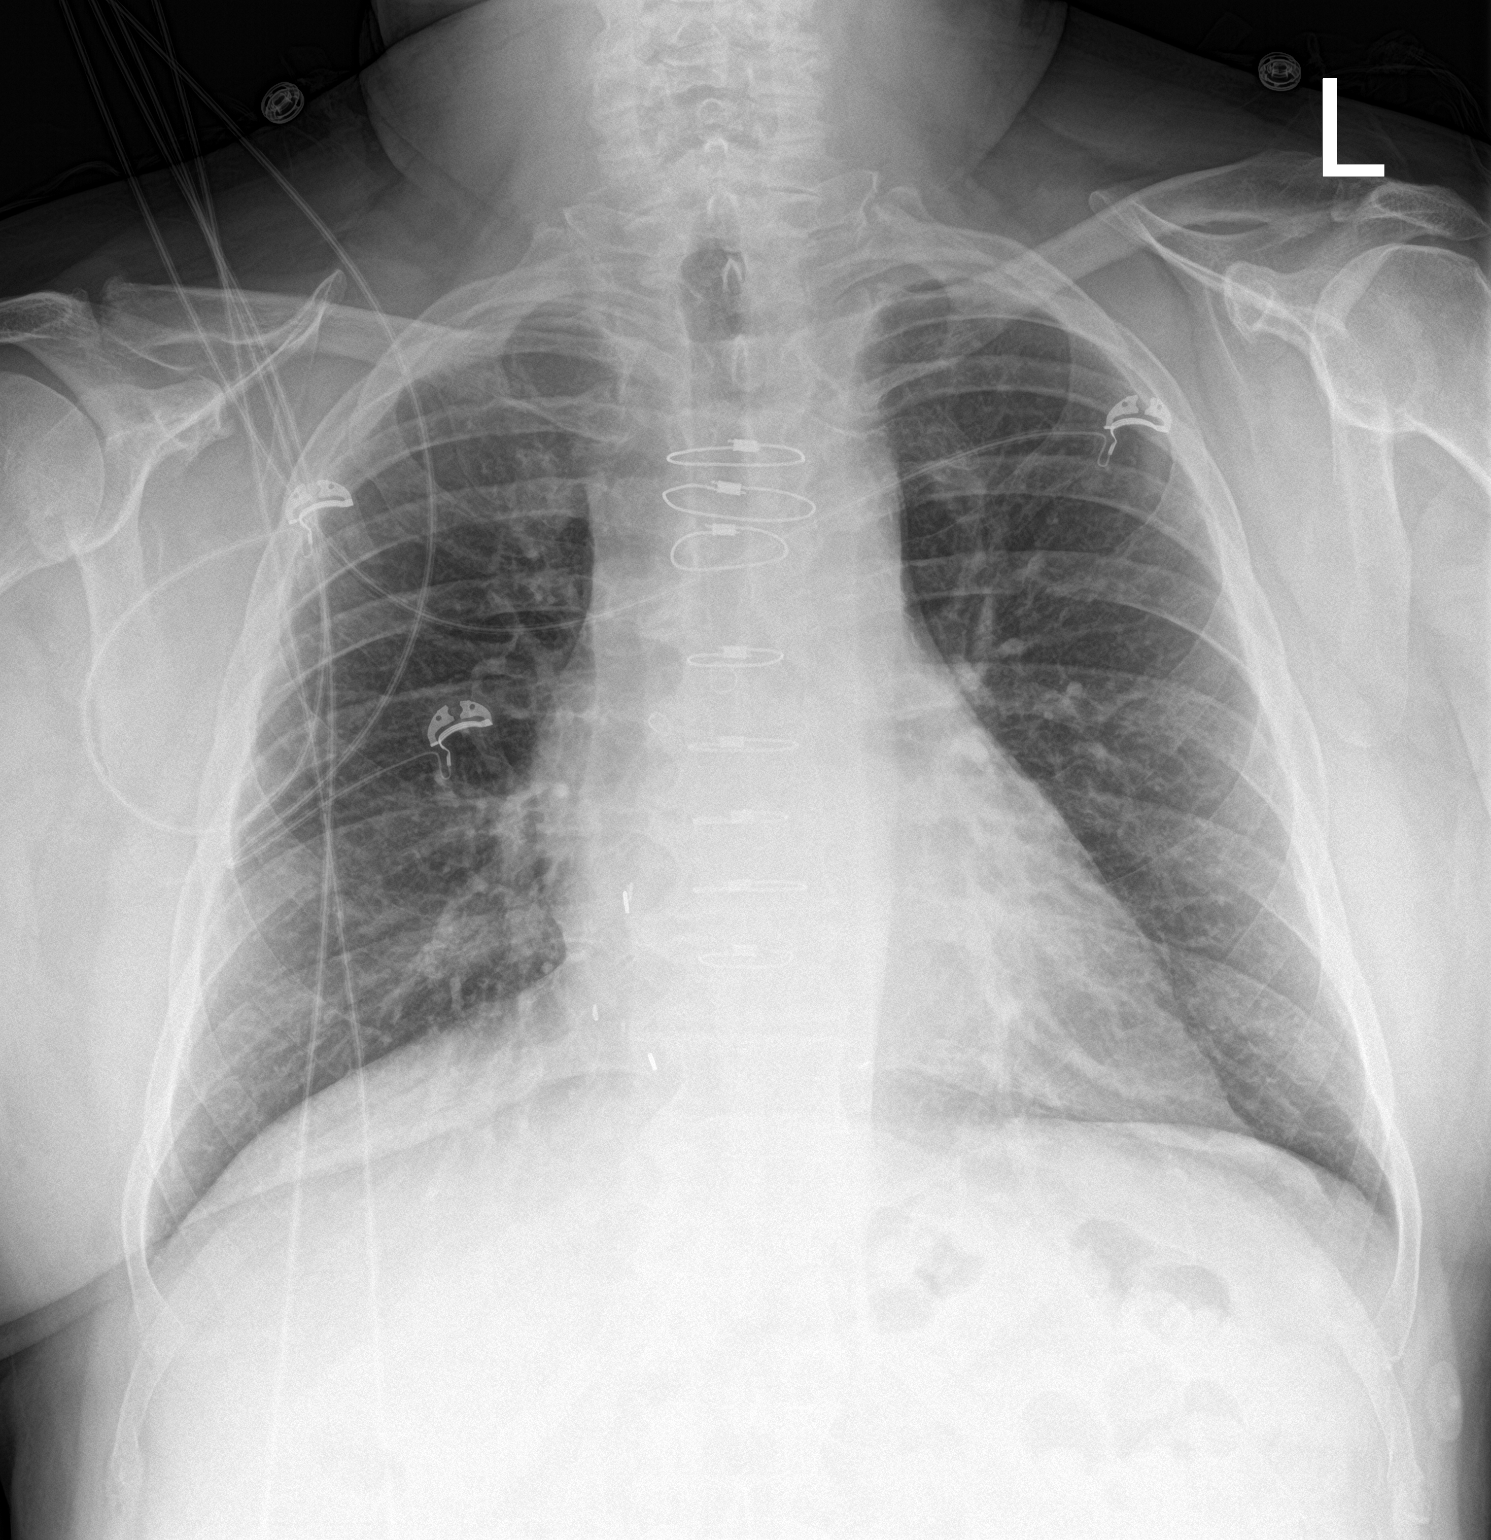

[chest lat]
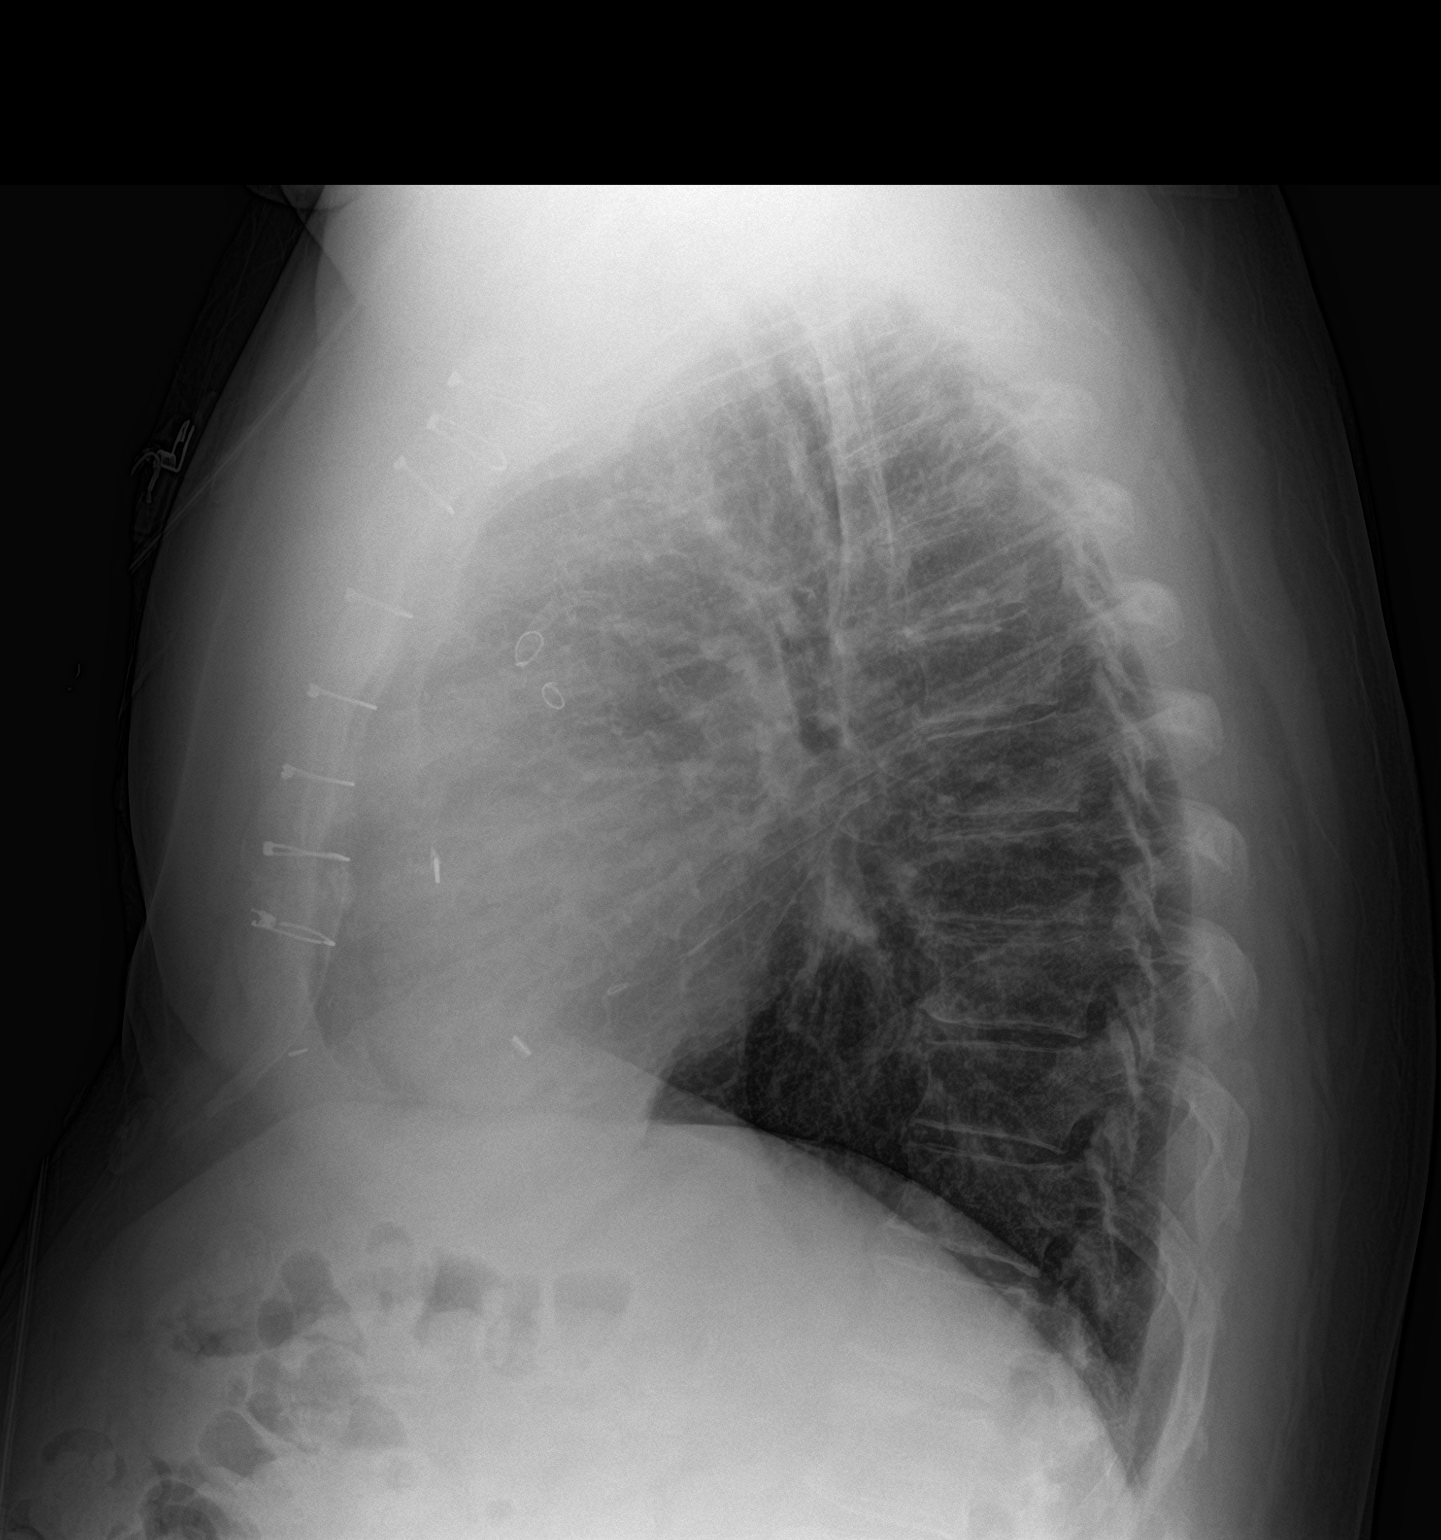

[2 of 2 positions shown; findings below may reference images not displayed]

FINDINGS: Cardiac shadow is within normal limits. Postsurgical changes are
again seen and stable. The lungs are clear bilaterally. No focal
infiltrate or effusion is noted. Bony structures show degenerative
change of thoracic spine.
IMPRESSION: No acute abnormality noted.
